# Patient Record
Sex: Male | Born: 1955
Health system: Southern US, Community
[De-identification: ages and names within clinical notes are randomized; demographics above are authoritative.]

## PROBLEM LIST (undated history)

## (undated) DIAGNOSIS — B029 Zoster without complications: Secondary | ICD-10-CM

## (undated) DIAGNOSIS — M542 Cervicalgia: Secondary | ICD-10-CM

## (undated) DIAGNOSIS — M545 Low back pain, unspecified: Secondary | ICD-10-CM

## (undated) DIAGNOSIS — N189 Chronic kidney disease, unspecified: Secondary | ICD-10-CM

## (undated) HISTORY — DX: Low back pain, unspecified: M54.50

## (undated) HISTORY — DX: Low back pain: M54.5

## (undated) HISTORY — DX: Zoster without complications: B02.9

## (undated) HISTORY — DX: Chronic kidney disease, unspecified: N18.9

## (undated) HISTORY — PX: NO PAST SURGERIES: SHX2092

## (undated) HISTORY — DX: Cervicalgia: M54.2

---

## 2004-07-26 ENCOUNTER — Emergency Department: Payer: Self-pay | Admitting: Internal Medicine

## 2006-05-12 ENCOUNTER — Emergency Department: Payer: Self-pay | Admitting: Emergency Medicine

## 2007-03-02 ENCOUNTER — Emergency Department: Payer: Self-pay | Admitting: Emergency Medicine

## 2007-08-08 ENCOUNTER — Emergency Department: Payer: Self-pay | Admitting: Emergency Medicine

## 2007-08-13 ENCOUNTER — Ambulatory Visit: Payer: Self-pay | Admitting: Family Medicine

## 2007-08-13 DIAGNOSIS — M545 Low back pain, unspecified: Secondary | ICD-10-CM | POA: Insufficient documentation

## 2007-09-04 ENCOUNTER — Ambulatory Visit: Payer: Self-pay | Admitting: Orthopedic Surgery

## 2007-09-04 DIAGNOSIS — N189 Chronic kidney disease, unspecified: Secondary | ICD-10-CM

## 2007-09-04 HISTORY — DX: Chronic kidney disease, unspecified: N18.9

## 2008-07-04 DIAGNOSIS — B029 Zoster without complications: Secondary | ICD-10-CM | POA: Insufficient documentation

## 2013-06-03 ENCOUNTER — Ambulatory Visit: Payer: Self-pay | Admitting: Gastroenterology

## 2013-06-03 LAB — HM COLONOSCOPY

## 2013-06-05 LAB — PATHOLOGY REPORT

## 2014-12-05 ENCOUNTER — Telehealth: Payer: Self-pay | Admitting: Family Medicine

## 2014-12-05 NOTE — Telephone Encounter (Signed)
Ok for 45 minute appointment. 

## 2014-12-05 NOTE — Telephone Encounter (Signed)
I called pt and scheduled appt for 12/08/14. Thanks TNP

## 2014-12-05 NOTE — Telephone Encounter (Signed)
See below please-aa 

## 2014-12-05 NOTE — Telephone Encounter (Addendum)
Pt's wife Eber Jones called to get pt a CPE scheduled but pt hasn't been seen since 08/27/09. Eber Jones is a pt of Dr. Santiago Bur and would like for our office to reestablish pt. Pt was with Dr. Katrinka Blazing before she left the office then started seeing Nadine Counts. Can Nadine Counts reestablish pt? Please advise. Thanks TNP

## 2014-12-08 ENCOUNTER — Other Ambulatory Visit
Admission: RE | Admit: 2014-12-08 | Discharge: 2014-12-08 | Disposition: A | Discharge status: Home / Self Care | Payer: 59 | Source: Ambulatory Visit | Attending: Family Medicine | Admitting: Family Medicine

## 2014-12-08 ENCOUNTER — Ambulatory Visit (INDEPENDENT_AMBULATORY_CARE_PROVIDER_SITE_OTHER): Payer: 59 | Admitting: Family Medicine

## 2014-12-08 ENCOUNTER — Encounter: Payer: Self-pay | Admitting: Family Medicine

## 2014-12-08 VITALS — BP 112/70 | HR 53 | Temp 98.7°F | Resp 14 | Ht 74.0 in | Wt 185.6 lb

## 2014-12-08 DIAGNOSIS — Z23 Encounter for immunization: Secondary | ICD-10-CM

## 2014-12-08 DIAGNOSIS — F17200 Nicotine dependence, unspecified, uncomplicated: Secondary | ICD-10-CM

## 2014-12-08 DIAGNOSIS — Z0000 Encounter for general adult medical examination without abnormal findings: Secondary | ICD-10-CM | POA: Diagnosis present

## 2014-12-08 LAB — LIPID PANEL
Cholesterol: 180 mg/dL (ref 0–200)
HDL: 66 mg/dL (ref >40)
LDL Cholesterol: 100 mg/dL — ABNORMAL HIGH (ref 0–99)
Total CHOL/HDL Ratio: 2.7 RATIO
Triglycerides: 71 mg/dL (ref <150)
VLDL: 14 mg/dL (ref 0–40)

## 2014-12-08 LAB — COMPREHENSIVE METABOLIC PANEL
ALBUMIN: 4.6 g/dL (ref 3.5–5.0)
ALK PHOS: 67 U/L (ref 38–126)
ALT: 41 U/L (ref 17–63)
AST: 32 U/L (ref 15–41)
Anion gap: 8 (ref 5–15)
BILIRUBIN TOTAL: 0.5 mg/dL (ref 0.3–1.2)
BUN: 18 mg/dL (ref 6–20)
CO2: 25 mmol/L (ref 22–32)
Calcium: 9.3 mg/dL (ref 8.9–10.3)
Chloride: 106 mmol/L (ref 101–111)
Creatinine, Ser: 1.37 mg/dL — ABNORMAL HIGH (ref 0.61–1.24)
GFR calc Af Amer: >60 mL/min (ref >60)
GFR calc non Af Amer: 55 mL/min — ABNORMAL LOW (ref >60)
Glucose, Bld: 94 mg/dL (ref 65–99)
Potassium: 4.5 mmol/L (ref 3.5–5.1)
SODIUM: 139 mmol/L (ref 135–145)
TOTAL PROTEIN: 7.4 g/dL (ref 6.5–8.1)

## 2014-12-08 LAB — PSA: PSA: 0.64 ng/mL (ref 0.00–4.00)

## 2014-12-08 MED ORDER — BUPROPION HCL ER (SR) 150 MG PO TB12: ORAL_TABLET | ORAL | Status: DC
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
## 2014-12-08 NOTE — Patient Instructions (Signed)
We will call you with lab results.       

## 2014-12-09 ENCOUNTER — Telehealth: Payer: Self-pay

## 2014-12-09 NOTE — Telephone Encounter (Signed)
Spoke with patient on the phone and advised of report, he wants to know if he should drink more water since his kidney numbers were elevated? Or what you recommend? Please advise. KW

## 2014-12-09 NOTE — Telephone Encounter (Signed)
Patient has been advised as below. KW 

## 2014-12-09 NOTE — Telephone Encounter (Signed)
-----   Message from Anola Gurney, [place] sent at 12/09/2014  7:46 AM EDT ----- Labs look good except mild elevation in one of your kidney numbers. Would recheck in 3 months.

## 2014-12-09 NOTE — Telephone Encounter (Signed)
Drinking more water might help if he usually drinks little. Keep a water bottle with him during his work hours and sip throughout the day.

## 2016-01-29 ENCOUNTER — Ambulatory Visit (INDEPENDENT_AMBULATORY_CARE_PROVIDER_SITE_OTHER): Payer: 59 | Admitting: Family Medicine

## 2016-01-29 VITALS — BP 102/64 | HR 62 | Temp 98.7°F | Resp 14 | Wt 189.0 lb

## 2016-01-29 DIAGNOSIS — M79674 Pain in right toe(s): Secondary | ICD-10-CM | POA: Diagnosis not present

## 2016-01-29 MED ORDER — MELOXICAM 15 MG PO TABS: 15.0000 mg | ORAL_TABLET | Freq: Every day | ORAL | 0 refills | Status: DC

## 2016-01-29 NOTE — Patient Instructions (Signed)
Continue wearing new shoes to provide better support. Call me if your toe is not improving over the next week or two.

## 2016-01-29 NOTE — Progress Notes (Signed)
Subjective:     Patient ID: Keith Mendez, male   DOB: Nov 02, 1955, 60 y.o.   MRN: [id number]  HPI  Chief Complaint  Patient presents with  . Foot Pain    patient states he has had right foot pain for about 1 month. Pain comes and goes, sometimes is very achy, has burning sensation at times. Pain located on the inner side of the foot. Swelling is present at times. This has been bothering patient more recently since he is working 2 jobs now and is on his feet more. No injury to the area that he recalls.  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 Was wearing old sneakers which did not give his toe much support. He has recently started wearing new and more supportive foot wear. No hx of gout and states his toe never became red, significantly swollen, or exquisitely tender. Has not taken any medication for his sx.   Review of Systems     Objective:   Physical Exam  Constitutional: He appears well-developed and well-nourished. No distress.  Cardiovascular:  Pulses:      Dorsalis pedis pulses are 2+ on the right side.       Posterior tibial pulses are 2+ on the right side.  Musculoskeletal:  Localizes to his right first MTP joint. No swelling, erythema or tenderness on presentation. DF/PF 5/5       Assessment:    1. Pain of right great toe: suspect overuse injury/osteoarthritis - meloxicam (MOBIC) 15 MG tablet; Take 1 tablet (15 mg total) by mouth daily.  Dispense: 30 tablet; Refill: 0    Plan:    Call if not improving for podiatry referral.

## 2016-06-04 DIAGNOSIS — H52221 Regular astigmatism, right eye: Secondary | ICD-10-CM | POA: Diagnosis not present

## 2016-06-04 DIAGNOSIS — H5203 Hypermetropia, bilateral: Secondary | ICD-10-CM | POA: Diagnosis not present

## 2016-06-04 DIAGNOSIS — H524 Presbyopia: Secondary | ICD-10-CM | POA: Diagnosis not present

## 2016-06-04 DIAGNOSIS — H3562 Retinal hemorrhage, left eye: Secondary | ICD-10-CM | POA: Diagnosis not present

## 2016-07-20 ENCOUNTER — Encounter: Payer: Self-pay | Admitting: Physician Assistant

## 2016-07-20 ENCOUNTER — Ambulatory Visit (INDEPENDENT_AMBULATORY_CARE_PROVIDER_SITE_OTHER): Payer: 59 | Admitting: Physician Assistant

## 2016-07-20 VITALS — BP 134/86 | HR 96 | Temp 102.2°F | Resp 16 | Wt 195.0 lb

## 2016-07-20 DIAGNOSIS — Z20828 Contact with and (suspected) exposure to other viral communicable diseases: Secondary | ICD-10-CM | POA: Diagnosis not present

## 2016-07-20 DIAGNOSIS — M549 Dorsalgia, unspecified: Secondary | ICD-10-CM

## 2016-07-20 DIAGNOSIS — R112 Nausea with vomiting, unspecified: Secondary | ICD-10-CM | POA: Diagnosis not present

## 2016-07-20 DIAGNOSIS — R197 Diarrhea, unspecified: Secondary | ICD-10-CM

## 2016-07-20 LAB — POCT URINALYSIS DIPSTICK
Bilirubin, UA: NEGATIVE
Glucose, UA: NEGATIVE
Ketones, UA: 1.03
Leukocytes, UA: NEGATIVE
Nitrite, UA: NEGATIVE
Protein, UA: NEGATIVE
Spec Grav, UA: >=1.03 — AB (ref 1.010–1.025)
Urobilinogen, UA: 0.2 E.U./dL
pH, UA: 6 (ref 5.0–8.0)

## 2016-07-20 MED ORDER — OSELTAMIVIR PHOSPHATE 75 MG PO CAPS: 75.0000 mg | ORAL_CAPSULE | Freq: Two times a day (BID) | ORAL | 0 refills | Status: AC

## 2016-07-20 MED ORDER — PROMETHAZINE HCL 12.5 MG PO TABS: 12.5000 mg | ORAL_TABLET | Freq: Four times a day (QID) | ORAL | 0 refills | Status: DC | PRN

## 2016-07-20 MED ORDER — CIPROFLOXACIN HCL 500 MG PO TABS: 500.0000 mg | ORAL_TABLET | Freq: Two times a day (BID) | ORAL | 0 refills | Status: DC

## 2016-07-20 NOTE — Progress Notes (Signed)
Patient: Keith Mendez Male    DOB: 08-05-1955   61 y.o.   MRN: [id number] Visit Date: 07/20/2016  Today's Provider: Trey Sailors, PA-C   Chief Complaint  Patient presents with  . Emesis  . Diarrhea   Subjective:    Emesis   This is a new problem. The problem occurs 2 to 4 times per day. The problem has been unchanged. There has been no fever. Associated symptoms include arthralgias, chills, coughing, diarrhea, headaches and URI. Pertinent negatives include no abdominal pain, chest pain, dizziness, fever, myalgias or sweats.  Diarrhea   This is a new problem. The current episode started yesterday. The problem has been unchanged. The patient states that diarrhea does not awaken him from sleep. Associated symptoms include arthralgias, chills, coughing, headaches, a URI and vomiting. Pertinent negatives include no abdominal pain, fever, myalgias or sweats.   Patient is a 61 y/o man presenting today with 1 day of nausea/vomiting/diarrhea and back pain. He said he felt ill suddenly yesterday and vomited four times. He had diarrhea 1-2 that was nonbloody and formed. He has muscle aches. He is denying any abdominal pain. He does report that he is having some low back pain, buttock pain, and difficulty getting comfortable in his seat the past night. He is not having any dysuria, frequency. He is having a fever. He hasn't eaten anything today because he's been so nauseas.   Additionally his wife is in the clinic today. She is testing positive for Flu B.   No Known Allergies   Current Outpatient Prescriptions:  .  buPROPion (WELLBUTRIN SR) 150 MG 12 hr tablet, Start at one pill daily for at least a week., Disp: 60 tablet, Rfl: 2 .  meloxicam (MOBIC) 15 MG tablet, Take 1 tablet (15 mg total) by mouth daily., Disp: 30 tablet, Rfl: 0  Review of Systems  Constitutional: Positive for chills and fatigue. Negative for activity change, appetite change, diaphoresis, fever and unexpected  weight change.  HENT: Positive for congestion, postnasal drip, rhinorrhea and sneezing. Negative for ear discharge, ear pain, nosebleeds, sinus pain, sinus pressure, sore throat, tinnitus, trouble swallowing and voice change.   Eyes: Positive for discharge.  Respiratory: Positive for cough. Negative for apnea, choking, chest tightness, shortness of breath, wheezing and stridor.   Cardiovascular: Negative for chest pain.  Gastrointestinal: Positive for diarrhea, nausea and vomiting. Negative for abdominal distention, abdominal pain, anal bleeding, blood in stool, constipation and rectal pain.  Musculoskeletal: Positive for arthralgias and back pain. Negative for myalgias.  Neurological: Positive for headaches. Negative for dizziness and numbness.    Social History  Substance Use Topics  . Smoking status: Current Every Day Smoker    Packs/day: 0.50    Types: Cigarettes  . Smokeless tobacco: Never Used  . Alcohol use 0.0 oz/week     Comment: occasional   Objective:   BP 134/86 (BP Location: Left Arm, Patient Position: Sitting, Cuff Size: Normal)   Pulse 96   Temp (!) 102.2 F (39 C) (Oral)   Resp 16   Wt 195 lb (88.5 kg)   BMI 25.04 kg/m  Vitals:   07/20/16 1542  BP: 134/86  Pulse: 96  Resp: 16  Temp: (!) 102.2 F (39 C)  TempSrc: Oral  Weight: 195 lb (88.5 kg)     Physical Exam  Constitutional: He is oriented to person, place, and time. He appears well-developed and well-nourished. He appears ill. No distress.  Cardiovascular: Normal rate  and regular rhythm.   Pulmonary/Chest: Effort normal and breath sounds normal.  Abdominal: Soft. Bowel sounds are normal. He exhibits no distension. There is no tenderness. There is CVA tenderness. There is no rebound and no guarding.  Neurological: He is alert and oriented to person, place, and time.  Skin: Skin is warm and dry.  Moist mucous membranes, skin with good turgor.   Psychiatric: He has a normal mood and affect. His behavior  is normal.        Assessment & Plan:     1. Nausea vomiting and diarrhea  Patient is nontoxic in office, though is febrile. His flu was negative but his wife tested positive today in our clinic so will treat empirically. Has significant CVA tenderness in office today with large blood on urine dipstick. Will send of for UA and culture to evaluate for pyelonephritis, but will cover for this as well with cipro. Advised patient to keep taking medications until we tell him otherwise. If his fever increases, he can't stop vomiting, he has severe pain, he needs to be seen emergently.   - POCT urinalysis dipstick - promethazine (PHENERGAN) 12.5 MG tablet; Take 1 tablet (12.5 mg total) by mouth every 6 (six) hours as needed for nausea or vomiting.  Dispense: 30 tablet; Refill: 0 - Urinalysis, microscopic only - CULTURE, URINE COMPREHENSIVE - ciprofloxacin (CIPRO) 500 MG tablet; Take 1 tablet (500 mg total) by mouth 2 (two) times daily.  Dispense: 20 tablet; Refill: 0  2. Acute left-sided back pain, unspecified back location   - POCT urinalysis dipstick - Urinalysis, microscopic only - CULTURE, URINE COMPREHENSIVE - ciprofloxacin (CIPRO) 500 MG tablet; Take 1 tablet (500 mg total) by mouth 2 (two) times daily.  Dispense: 20 tablet; Refill: 0  3. Exposure to the flu  - oseltamivir (TAMIFLU) 75 MG capsule; Take 1 capsule (75 mg total) by mouth 2 (two) times daily.  Dispense: 10 capsule; Refill: 0  Return if symptoms worsen or fail to improve.  The entirety of the information documented in the History of Present Illness, Review of Systems and Physical Exam were personally obtained by me. Portions of this information were initially documented by Kavin Leech, CMA and reviewed by me for thoroughness and accuracy.         Trey Sailors, PA-C  [facility]

## 2016-07-20 NOTE — Patient Instructions (Signed)

## 2016-07-21 LAB — URINALYSIS, MICROSCOPIC ONLY: Casts: NONE SEEN /lpf

## 2016-07-22 LAB — CULTURE, URINE COMPREHENSIVE

## 2016-07-26 ENCOUNTER — Ambulatory Visit
Admission: RE | Admit: 2016-07-26 | Discharge: 2016-07-26 | Disposition: A | Discharge status: Home / Self Care | Payer: 59 | Source: Ambulatory Visit | Attending: Physician Assistant | Admitting: Physician Assistant

## 2016-07-26 ENCOUNTER — Other Ambulatory Visit: Payer: Self-pay | Admitting: Physician Assistant

## 2016-07-26 ENCOUNTER — Ambulatory Visit (INDEPENDENT_AMBULATORY_CARE_PROVIDER_SITE_OTHER): Payer: 59 | Admitting: Physician Assistant

## 2016-07-26 ENCOUNTER — Encounter: Payer: Self-pay | Admitting: Physician Assistant

## 2016-07-26 ENCOUNTER — Telehealth: Payer: Self-pay

## 2016-07-26 ENCOUNTER — Telehealth: Payer: Self-pay | Admitting: Physician Assistant

## 2016-07-26 VITALS — BP 114/78 | HR 68 | Temp 99.2°F | Resp 16 | Wt 189.0 lb

## 2016-07-26 DIAGNOSIS — J189 Pneumonia, unspecified organism: Secondary | ICD-10-CM

## 2016-07-26 DIAGNOSIS — R918 Other nonspecific abnormal finding of lung field: Secondary | ICD-10-CM | POA: Diagnosis not present

## 2016-07-26 DIAGNOSIS — R05 Cough: Secondary | ICD-10-CM

## 2016-07-26 DIAGNOSIS — R509 Fever, unspecified: Secondary | ICD-10-CM

## 2016-07-26 DIAGNOSIS — J181 Lobar pneumonia, unspecified organism: Secondary | ICD-10-CM

## 2016-07-26 DIAGNOSIS — R059 Cough, unspecified: Secondary | ICD-10-CM

## 2016-07-26 MED ORDER — AZITHROMYCIN 250 MG PO TABS: ORAL_TABLET | ORAL | 0 refills | Status: DC

## 2016-07-26 NOTE — Progress Notes (Signed)
Left message at home number listed to call back.  Patient has taken one week on cipro for suspected pyelonephritis with good clinical resolution. Came in today with persistent cough with CXR showing pneumonia. Will discontinue cipro and have him take Z-pack. Would like to see him back in clinic in one week for recheck and repeat urine and will have follow up CXR in 1 month.

## 2016-07-26 NOTE — Telephone Encounter (Signed)
Pt advised.   Thanks,   -Laura  

## 2016-07-26 NOTE — Telephone Encounter (Signed)
-----   Message from Adline Peals, CMA sent at 07/26/2016 10:10 AM EDT ----- [facility]  ED

## 2016-07-26 NOTE — Progress Notes (Signed)
Patient: Keith Mendez Male    DOB: Feb 17, 1956   61 y.o.   MRN: [id number] Visit Date: 07/26/2016  Today's Provider: Trey Sailors, PA-C   Chief Complaint  Patient presents with  . Cough   Subjective:    Cough  This is a new problem. The current episode started 1 to 4 weeks ago. The problem has been unchanged. The cough is non-productive. Associated symptoms include myalgias, nasal congestion, postnasal drip, rhinorrhea, shortness of breath and wheezing. Pertinent negatives include no chest pain, chills, ear congestion, ear pain, eye redness, fever, headaches, heartburn, hemoptysis, rash, sore throat, sweats or weight loss.   Keith Mendez is a 61 y/o male who smokes 1/3 packs per day who presents today for the cough. He was seen by myself 1 week ago for respiratory symptoms and back pain in the clinic. His wife was dx with the flu. He was febrile to 102.2 F and had blood in his urine on dipstick as well as CVA tenderness. Treated empirically with cipro 500 mg BID x 10 days for pyelonephritis and urine cx did not grow anything.   Returning today for nagging cough. Not productive but feels like it could be. No fevers, back pain has resolved, urinating okay. Reduced appetite. No sinus congestion. Back pain and fever have improved.  No Known Allergies   Current Outpatient Prescriptions:  .  buPROPion (WELLBUTRIN SR) 150 MG 12 hr tablet, Start at one pill daily for at least a week., Disp: 60 tablet, Rfl: 2 .  meloxicam (MOBIC) 15 MG tablet, Take 1 tablet (15 mg total) by mouth daily., Disp: 30 tablet, Rfl: 0 .  azithromycin (ZITHROMAX) 250 MG tablet, Take 2 tablets on day 1, and one tablet on the following four days., Disp: 6 tablet, Rfl: 0 .  promethazine (PHENERGAN) 12.5 MG tablet, Take 1 tablet (12.5 mg total) by mouth every 6 (six) hours as needed for nausea or vomiting. (Patient not taking: Reported on 07/26/2016), Disp: 30 tablet, Rfl: 0  Review of Systems  Constitutional:  Positive for appetite change and fatigue. Negative for activity change, chills, diaphoresis, fever, unexpected weight change and weight loss.  HENT: Positive for congestion, postnasal drip, rhinorrhea and sneezing. Negative for ear discharge, ear pain, nosebleeds, sinus pain, sinus pressure, sore throat, tinnitus and trouble swallowing.   Eyes: Positive for discharge. Negative for photophobia, pain, redness, itching and visual disturbance.  Respiratory: Positive for cough, shortness of breath and wheezing. Negative for apnea, hemoptysis, choking, chest tightness and stridor.   Cardiovascular: Negative for chest pain.  Gastrointestinal: Positive for nausea. Negative for abdominal distention, abdominal pain, anal bleeding, blood in stool, constipation, diarrhea, heartburn, rectal pain and vomiting.  Musculoskeletal: Positive for arthralgias and myalgias. Negative for back pain, gait problem, joint swelling, neck pain and neck stiffness.  Skin: Negative for rash.  Neurological: Positive for light-headedness (Pt says he feels "Off balance"). Negative for dizziness and headaches.    Social History  Substance Use Topics  . Smoking status: Current Every Day Smoker    Packs/day: 0.50    Types: Cigarettes  . Smokeless tobacco: Never Used  . Alcohol use 0.0 oz/week     Comment: occasional   Objective:   BP 114/78 (BP Location: Left Arm, Patient Position: Sitting, Cuff Size: Normal)   Pulse 68   Temp 99.2 F (37.3 C) (Oral)   Resp 16   Wt 189 lb (85.7 kg)   SpO2 97%   BMI 24.27 kg/m  Vitals:   07/26/16 1108  BP: 114/78  Pulse: 68  Resp: 16  Temp: 99.2 F (37.3 C)  TempSrc: Oral  SpO2: 97%  Weight: 189 lb (85.7 kg)     Physical Exam  Constitutional: He appears well-developed and well-nourished.  HENT:  Right Ear: External ear normal.  Left Ear: External ear normal.  Mouth/Throat: Oropharynx is clear and moist. No oropharyngeal exudate.  Eyes: Conjunctivae are normal.  Neck: Neck  supple.  Cardiovascular: Normal rate and regular rhythm.   Pulmonary/Chest: Effort normal. No respiratory distress. He has no wheezes.  Scattered ronchi in bilateral lung fields that clear with coughing.  Abdominal: Soft. Bowel sounds are normal. There is no CVA tenderness.  Lymphadenopathy:    He has no cervical adenopathy.  Skin: Skin is warm and dry.        Assessment & Plan:     1. Pneumonia of right middle lobe due to infectious organism (HCC)  CXR showed pneumonia. Will D/C cipro as he has had 1 week treatment and start Z-pack. Want him to follow up in one week for recheck, repeat urine. Will need follow up CXR in one month to monitor complete resolution.  2. Cough  See above.  - DG Chest 2 View; Future  3. Elevated temperature  See above.  - DG Chest 2 View; Future  The entirety of the information documented in the History of Present Illness, Review of Systems and Physical Exam were personally obtained by me. Portions of this information were initially documented by Kavin Leech, CMA and reviewed by me for thoroughness and accuracy.   Return in about 1 week (around 08/02/2016).       Trey Sailors, PA-C  [facility]

## 2016-07-26 NOTE — Telephone Encounter (Signed)
Left message to call back about results

## 2016-07-26 NOTE — Patient Instructions (Addendum)
MUCINEX DM AND PLENTY OF FLUIDS  Acute Bronchitis, Adult Acute bronchitis is when air tubes (bronchi) in the lungs suddenly get swollen. The condition can make it hard to breathe. It can also cause these symptoms:  A cough.  Coughing up clear, yellow, or green mucus.  Wheezing.  Chest congestion.  Shortness of breath.  A fever.  Body aches.  Chills.  A sore throat. Follow these instructions at home: Medicines   Take over-the-counter and prescription medicines only as told by your doctor.  If you were prescribed an antibiotic medicine, take it as told by your doctor. Do not stop taking the antibiotic even if you start to feel better. General instructions   Rest.  Drink enough fluids to keep your pee (urine) clear or pale yellow.  Avoid smoking and secondhand smoke. If you smoke and you need help quitting, ask your doctor. Quitting will help your lungs heal faster.  Use an inhaler, cool mist vaporizer, or humidifier as told by your doctor.  Keep all follow-up visits as told by your doctor. This is important. How is this prevented? To lower your risk of getting this condition again:  Wash your hands often with soap and water. If you cannot use soap and water, use hand sanitizer.  Avoid contact with people who have cold symptoms.  Try not to touch your hands to your mouth, nose, or eyes.  Make sure to get the flu shot every year. Contact a doctor if:  Your symptoms do not get better in 2 weeks. Get help right away if:  You cough up blood.  You have chest pain.  You have very bad shortness of breath.  You become dehydrated.  You faint (pass out) or keep feeling like you are going to pass out.  You keep throwing up (vomiting).  You have a very bad headache.  Your fever or chills gets worse. This information is not intended to replace advice given to you by your health care provider. Make sure you discuss any questions you have with your health care  provider. Document Released: 09/14/2007 Document Revised: 11/04/2015 Document Reviewed: 09/16/2015 Elsevier Interactive Patient Education  2017 [employer].

## 2016-07-27 NOTE — Telephone Encounter (Signed)
-----   Message from Trey Sailors, [place] sent at 07/27/2016  8:21 AM EDT ----- CXR did in fact show some pneumonia. It may be viral, but we treat with antibiotics anyway. I have sent in azithromycin. I want patient to discontinue his ciprofloxacin and start azithromcyin since they have interactions. I called him twice yesterday with no success. I also want him to follow up next week on Tuesday with me in clinic.

## 2016-07-27 NOTE — Telephone Encounter (Signed)
LMTCB

## 2016-07-29 NOTE — Telephone Encounter (Signed)
LMTCB 07/29/2016  Thanks,   -Vernona Rieger

## 2016-07-29 NOTE — Telephone Encounter (Signed)
[id number] 

## 2016-08-02 NOTE — Telephone Encounter (Signed)
Tried calling wife's number; her mailbox is full.   Thanks,   -Vernona Rieger

## 2016-08-02 NOTE — Telephone Encounter (Signed)
Mailbox is now full and can not take any messages. Will discuss with MA letter to be sent to patient.

## 2017-03-17 ENCOUNTER — Ambulatory Visit
Admission: RE | Admit: 2017-03-17 | Discharge: 2017-03-17 | Disposition: A | Discharge status: Home / Self Care | Payer: 59 | Source: Ambulatory Visit | Attending: Physician Assistant | Admitting: Physician Assistant

## 2017-03-17 ENCOUNTER — Encounter: Payer: Self-pay | Admitting: Physician Assistant

## 2017-03-17 ENCOUNTER — Ambulatory Visit (INDEPENDENT_AMBULATORY_CARE_PROVIDER_SITE_OTHER): Payer: 59 | Admitting: Physician Assistant

## 2017-03-17 VITALS — BP 118/68 | HR 84 | Temp 98.9°F | Resp 16 | Ht 74.0 in | Wt 192.0 lb

## 2017-03-17 DIAGNOSIS — R05 Cough: Secondary | ICD-10-CM

## 2017-03-17 DIAGNOSIS — R059 Cough, unspecified: Secondary | ICD-10-CM

## 2017-03-17 MED ORDER — PREDNISONE 20 MG PO TABS: 20.0000 mg | ORAL_TABLET | Freq: Two times a day (BID) | ORAL | 0 refills | Status: AC

## 2017-03-17 MED ORDER — DOXYCYCLINE HYCLATE 100 MG PO TABS
100.0000 mg | ORAL_TABLET | Freq: Two times a day (BID) | ORAL | 0 refills | Status: AC
Start: 2017-03-17 — End: 2017-03-24

## 2017-03-17 NOTE — Progress Notes (Signed)
Patient: Keith BonierClifton L Bartolome Male    DOB: 06-Aug-1955   61 y.o.   MRN: [id number] Visit Date: 03/17/2017  Today's Provider: Trey SailorsAdriana M Pollak, PA-C   Chief Complaint  Patient presents with  . Sinusitis   Subjective:    Keith Mendez is a 61 y/o man with 21 pack year smoking history presents today with cough and below symptoms. No SOB, chest pain. He was diagnosed with pneumonia in 07/2016 and was very difficult to reach. He cannot remember if he ever took the azithromycin. He did not get follow up xray to see resolution.  Sinusitis  This is a new problem. The current episode started in the past 7 days (about 3 days). The problem has been gradually worsening since onset. There has been no fever. The pain is moderate. Associated symptoms include congestion, coughing, headaches, sinus pressure and sneezing. Past treatments include acetaminophen and oral decongestants. The treatment provided mild relief.       No Known Allergies   Current Outpatient Medications:  .  buPROPion (WELLBUTRIN SR) 150 MG 12 hr tablet, Start at one pill daily for at least a week., Disp: 60 tablet, Rfl: 2 .  meloxicam (MOBIC) 15 MG tablet, Take 1 tablet (15 mg total) by mouth daily., Disp: 30 tablet, Rfl: 0 .  doxycycline (VIBRA-TABS) 100 MG tablet, Take 1 tablet (100 mg total) by mouth 2 (two) times daily for 7 days., Disp: 14 tablet, Rfl: 0 .  predniSONE (DELTASONE) 20 MG tablet, Take 1 tablet (20 mg total) by mouth 2 (two) times daily with a meal for 5 days., Disp: 10 tablet, Rfl: 0 .  promethazine (PHENERGAN) 12.5 MG tablet, Take 1 tablet (12.5 mg total) by mouth every 6 (six) hours as needed for nausea or vomiting. (Patient not taking: Reported on 07/26/2016), Disp: 30 tablet, Rfl: 0  Review of Systems  HENT: Positive for congestion, sinus pressure and sneezing.   Respiratory: Positive for cough.   Neurological: Positive for headaches.    Social History   Tobacco Use  . Smoking status: Current Every  Day Smoker    Packs/day: 0.50    Types: Cigarettes  . Smokeless tobacco: Never Used  Substance Use Topics  . Alcohol use: Yes    Alcohol/week: 0.0 oz    Comment: occasional   Objective:   BP 118/68 (BP Location: Right Arm, Patient Position: Sitting, Cuff Size: Normal)   Pulse 84   Temp 98.9 F (37.2 C)   Resp 16   Ht 6\' 2"  (1.88 m)   Wt 192 lb (87.1 kg)   SpO2 96%   BMI 24.65 kg/m  Vitals:   03/17/17 1341  BP: 118/68  Pulse: 84  Resp: 16  Temp: 98.9 F (37.2 C)  SpO2: 96%  Weight: 192 lb (87.1 kg)  Height: 6\' 2"  (1.88 m)     Physical Exam  Constitutional: He is oriented to person, place, and time. He appears well-developed and well-nourished. He appears ill.  HENT:  Right Ear: Tympanic membrane and external ear normal.  Left Ear: Tympanic membrane and external ear normal.  Mouth/Throat: Oropharynx is clear and moist. No oropharyngeal exudate.  Eyes: Conjunctivae are normal.  Neck: Neck supple.  Cardiovascular: Normal rate and regular rhythm.  Pulmonary/Chest: Effort normal. He has wheezes. He has no rales.  Diffuse wheezing and rhonchi  Lymphadenopathy:    He has no cervical adenopathy.  Neurological: He is alert and oriented to person, place, and time.  Skin:  Skin is warm and dry.  Psychiatric: He has a normal mood and affect. His behavior is normal.        Assessment & Plan:     1. Cough  61 y/o man with significant tobacco abuse and history of pneumonia, possibly untreated. Will treat as bronchitis/possible COPD exacerbation. He does not carry diagnosis but does have significant smoking history. Will get CXR to follow up from last image showing pneumonia. If he does not hear from me, he should take both steroids and abx. If there are significant findings on CXR, I will direct him otherwise.   - DG Chest 2 View; Future - doxycycline (VIBRA-TABS) 100 MG tablet; Take 1 tablet (100 mg total) by mouth 2 (two) times daily for 7 days.  Dispense: 14 tablet;  Refill: 0 - predniSONE (DELTASONE) 20 MG tablet; Take 1 tablet (20 mg total) by mouth 2 (two) times daily with a meal for 5 days.  Dispense: 10 tablet; Refill: 0  Return if symptoms worsen or fail to improve.  The entirety of the information documented in the History of Present Illness, Review of Systems and Physical Exam were personally obtained by me. Portions of this information were initially documented by Kavin LeechLaura Walsh, CMA and reviewed by me for thoroughness and accuracy.        Trey SailorsAdriana M Pollak, PA-C  [facility]

## 2017-03-17 NOTE — Patient Instructions (Signed)
If you do not get a call from me, please take both steroids and antibiotics. Otherwise, I will call to direct you.   Acute Bronchitis, Adult Acute bronchitis is when air tubes (bronchi) in the lungs suddenly get swollen. The condition can make it hard to breathe. It can also cause these symptoms:  A cough.  Coughing up clear, yellow, or green mucus.  Wheezing.  Chest congestion.  Shortness of breath.  A fever.  Body aches.  Chills.  A sore throat.  Follow these instructions at home: Medicines  Take over-the-counter and prescription medicines only as told by your doctor.  If you were prescribed an antibiotic medicine, take it as told by your doctor. Do not stop taking the antibiotic even if you start to feel better. General instructions  Rest.  Drink enough fluids to keep your pee (urine) clear or pale yellow.  Avoid smoking and secondhand smoke. If you smoke and you need help quitting, ask your doctor. Quitting will help your lungs heal faster.  Use an inhaler, cool mist vaporizer, or humidifier as told by your doctor.  Keep all follow-up visits as told by your doctor. This is important. How is this prevented? To lower your risk of getting this condition again:  Wash your hands often with soap and water. If you cannot use soap and water, use hand sanitizer.  Avoid contact with people who have cold symptoms.  Try not to touch your hands to your mouth, nose, or eyes.  Make sure to get the flu shot every year.  Contact a doctor if:  Your symptoms do not get better in 2 weeks. Get help right away if:  You cough up blood.  You have chest pain.  You have very bad shortness of breath.  You become dehydrated.  You faint (pass out) or keep feeling like you are going to pass out.  You keep throwing up (vomiting).  You have a very bad headache.  Your fever or chills gets worse. This information is not intended to replace advice given to you by your health  care provider. Make sure you discuss any questions you have with your health care provider. Document Released: 09/14/2007 Document Revised: 11/04/2015 Document Reviewed: 09/16/2015 Elsevier Interactive Patient Education  2017 [employer]

## 2017-03-21 ENCOUNTER — Telehealth: Payer: Self-pay

## 2017-03-21 NOTE — Telephone Encounter (Signed)
-----   Message from Trey SailorsAdriana M Pollak, [place]-C sent at 03/21/2017  1:39 PM EST ----- CXR was clear, and improved from last time as well. Patient should be largely done with his abx and steroids by now. How is he feeling?

## 2017-03-21 NOTE — Telephone Encounter (Signed)
Tried calling; pt's voicemail box is full.   Thanks,   -Markez Dowland  

## 2017-03-29 NOTE — Telephone Encounter (Signed)
Tried calling; no answer.  03/29/2017   Thanks,   -Vernona RiegerLaura

## 2017-03-30 NOTE — Telephone Encounter (Signed)
Mailbox is full.

## 2017-05-29 ENCOUNTER — Telehealth: Payer: Self-pay

## 2017-05-29 ENCOUNTER — Ambulatory Visit (INDEPENDENT_AMBULATORY_CARE_PROVIDER_SITE_OTHER): Payer: 59 | Admitting: Physician Assistant

## 2017-05-29 ENCOUNTER — Encounter: Payer: Self-pay | Admitting: Physician Assistant

## 2017-05-29 VITALS — BP 110/72 | HR 84 | Temp 98.9°F | Resp 16 | Wt 194.0 lb

## 2017-05-29 DIAGNOSIS — M25571 Pain in right ankle and joints of right foot: Secondary | ICD-10-CM

## 2017-05-29 MED ORDER — MELOXICAM 7.5 MG PO TABS: ORAL_TABLET | ORAL | 0 refills | Status: DC

## 2017-05-29 NOTE — Patient Instructions (Signed)
Ankle Pain Many things can cause ankle pain, including an injury to the area and overuse of the ankle.The ankle joint holds your body weight and allows you to move around. Ankle pain can occur on either side or the back of one ankle or both ankles. Ankle pain may be sharp and burning or dull and aching. There may be tenderness, stiffness, redness, or warmth around the ankle. Follow these instructions at home: Activity  Rest your ankle as told by your health care provider. Avoid any activities that cause ankle pain.  Do exercises as told by your health care provider.  Ask your health care provider if you can drive. Using a brace, a bandage, or crutches  If you were given a brace: ? Wear it as told by your health care provider. ? Remove it when you take a bath or a shower. ? Try not to move your ankle very much, but wiggle your toes from time to time. This helps to prevent swelling.  If you were given an elastic bandage: ? Remove it when you take a bath or a shower. ? Try not to move your ankle very much, but wiggle your toes from time to time. This helps to prevent swelling. ? Adjust the bandage to make it more comfortable if it feels too tight. ? Loosen the bandage if you have numbness or tingling in your foot or if your foot turns cold and blue.  If you have crutches, use them as told by your health care provider. Continue to use them until you can walk without feeling pain in your ankle. Managing pain, stiffness, and swelling  Raise (elevate) your ankle above the level of your heart while you are sitting or lying down.  If directed, apply ice to the area: ? Put ice in a plastic bag. ? Place a towel between your skin and the bag. ? Leave the ice on for 20 minutes, 2-3 times per day. General instructions  Keep all follow-up visits as told by your health care provider. This is important.  Record this information that may be helpful for you and your health care provider: ? How  often you have ankle pain. ? Where the pain is located. ? What the pain feels like.  Take over-the-counter and prescription medicines only as told by your health care provider. Contact a health care provider if:  Your pain gets worse.  Your pain is not relieved with medicines.  You have a fever or chills.  You are having more trouble with walking.  You have new symptoms. Get help right away if:  Your foot, leg, toes, or ankle tingles or becomes numb.  Your foot, leg, toes, or ankle becomes swollen.  Your foot, leg, toes, or ankle turns pale or blue. This information is not intended to replace advice given to you by your health care provider. Make sure you discuss any questions you have with your health care provider. Document Released: 09/15/2009 Document Revised: 11/27/2015 Document Reviewed: 10/28/2014 Elsevier Interactive Patient Education  2018 Elsevier Inc.  

## 2017-05-29 NOTE — Progress Notes (Signed)
Patient: Keith Mendez Male    DOB: [birth year]   62 y.o.   MRN: [id number] Visit Date: 05/29/2017  Today's Provider: Trey Sailors, PA-C   Chief Complaint  Patient presents with  . Cyst    Right ankle    Subjective:    HPI   Keith Mendez is a 62 y/o man presenting today with right ankle pain x 3 days. He reports an area of redness and swelling on the outside of his right ankle. He denies pain when walking or moving his foot. Denies any injuries, twisting motions. He denies any open wound or bleeding. He denies any rash. There is some warmth to the area. He has had this before and it has usually gone away but this time it has persisted. He normally wears boots with thick boot socks but he was unable to tolerate them today.       No Known Allergies   Current Outpatient Medications:  .  buPROPion (WELLBUTRIN SR) 150 MG 12 hr tablet, Start at one pill daily for at least a week., Disp: 60 tablet, Rfl: 2 .  meloxicam (MOBIC) 15 MG tablet, Take 1 tablet (15 mg total) by mouth daily., Disp: 30 tablet, Rfl: 0 .  promethazine (PHENERGAN) 12.5 MG tablet, Take 1 tablet (12.5 mg total) by mouth every 6 (six) hours as needed for nausea or vomiting. (Patient not taking: Reported on 07/26/2016), Disp: 30 tablet, Rfl: 0  Review of Systems  Constitutional: Negative.   Musculoskeletal: Positive for arthralgias. Negative for back pain, gait problem, joint swelling, myalgias, neck pain and neck stiffness.    Social History   Tobacco Use  . Smoking status: Current Every Day Smoker    Packs/day: 0.50    Types: Cigarettes  . Smokeless tobacco: Never Used  Substance Use Topics  . Alcohol use: Yes    Alcohol/week: 0.0 oz    Comment: occasional   Objective:   BP 110/72 (BP Location: Right Arm, Patient Position: Sitting, Cuff Size: Large)   Pulse 84   Temp 98.9 F (37.2 C) (Oral)   Resp 16   Wt 194 lb (88 kg)   BMI 24.91 kg/m  Vitals:   05/29/17 1603  BP: 110/72  Pulse: 84   Resp: 16  Temp: 98.9 F (37.2 C)  TempSrc: Oral  Weight: 194 lb (88 kg)     Physical Exam  Constitutional: He is oriented to person, place, and time. He appears well-developed and well-nourished.  Cardiovascular: Normal rate and regular rhythm.  Pulses:      Dorsalis pedis pulses are 2+ on the right side, and 2+ on the left side.       Posterior tibial pulses are 2+ on the right side, and 2+ on the left side.  Cap refill <3 seconds bilateral lower extremities.  Musculoskeletal:       Feet:  Neurological: He is alert and oriented to person, place, and time.  Skin: Skin is warm and dry. No rash noted. There is erythema.  Psychiatric: He has a normal mood and affect. His behavior is normal.        Assessment & Plan:     1. Acute right ankle pain  There is no overlying rash or defined lesion, however small area of erythema and swelling over right lateral malleolus. Low suspicion for gout, fracture, dermatitis. Pulses in tact, bilateral lower extremities. Suspect some malleolar bursitis from boots. Instructed on NSAID use, abstain from wearing boots  right now.   - meloxicam (MOBIC) 7.5 MG tablet; Take 1 to 2 tablets daily PRN for pain.  Dispense: 30 tablet; Refill: 0  Return if symptoms worsen or fail to improve.  The entirety of the information documented in the History of Present Illness, Review of Systems and Physical Exam were personally obtained by me. Portions of this information were initially documented by Kavin LeechLaura Walsh, CMA and reviewed by me for thoroughness and accuracy.        Trey SailorsAdriana M Pollak, PA-C  [facility]

## 2017-05-29 NOTE — Telephone Encounter (Signed)
Patient called office today with concerns of a "bump/knot" that appeared three days agao on his lower leg right above ankle. Patient denies that "bump" has grown in size but states that is is painful to the touch and feels like it is throbbing. Patient denies leg pain or tenderness , he denies swelling, fever or warmth to the touch at site. Patient denied any injury that he can recall or insect bite. Patient has an appt this afternoon at 4Pm. [facility]

## 2017-05-29 NOTE — Telephone Encounter (Signed)
Noted, thank you

## 2017-08-30 ENCOUNTER — Encounter: Payer: Self-pay | Admitting: Physician Assistant

## 2017-08-30 ENCOUNTER — Ambulatory Visit (INDEPENDENT_AMBULATORY_CARE_PROVIDER_SITE_OTHER): Payer: 59 | Admitting: Physician Assistant

## 2017-08-30 ENCOUNTER — Ambulatory Visit: Payer: 59 | Admitting: Physician Assistant

## 2017-08-30 VITALS — BP 124/86 | HR 68 | Temp 98.2°F | Resp 16 | Wt 192.0 lb

## 2017-08-30 DIAGNOSIS — H6122 Impacted cerumen, left ear: Secondary | ICD-10-CM

## 2017-08-30 NOTE — Progress Notes (Signed)
Patient: Keith Mendez Male    DOB: 06-11-1955   62 y.o.   MRN: [id number] Visit Date: 08/30/2017  Today's Provider: Trey Sailors, PA-C   Chief Complaint  Patient presents with  . Hearing Loss    Left ear.  Started Sunday   Subjective:    Ear Fullness   There is pain in the left ear. This is a new problem. The current episode started in the past 7 days. The problem occurs constantly. The problem has been unchanged. There has been no fever. The patient is experiencing no pain. Associated symptoms include coughing, hearing loss and rhinorrhea. Pertinent negatives include no ear discharge, headaches or sore throat.       No Known Allergies   Current Outpatient Medications:  .  buPROPion (WELLBUTRIN SR) 150 MG 12 hr tablet, Start at one pill daily for at least a week. (Patient not taking: Reported on 08/30/2017), Disp: 60 tablet, Rfl: 2 .  meloxicam (MOBIC) 7.5 MG tablet, Take 1 to 2 tablets daily PRN for pain. (Patient not taking: Reported on 08/30/2017), Disp: 30 tablet, Rfl: 0 .  promethazine (PHENERGAN) 12.5 MG tablet, Take 1 tablet (12.5 mg total) by mouth every 6 (six) hours as needed for nausea or vomiting. (Patient not taking: Reported on 07/26/2016), Disp: 30 tablet, Rfl: 0  Review of Systems  Constitutional: Negative.   HENT: Positive for hearing loss and rhinorrhea. Negative for congestion, ear discharge, ear pain, postnasal drip, sinus pressure, sinus pain, sore throat, tinnitus and trouble swallowing.   Eyes: Negative.   Respiratory: Positive for cough. Negative for apnea, choking, chest tightness, shortness of breath, wheezing and stridor.   Gastrointestinal: Negative.   Neurological: Negative for dizziness, light-headedness and headaches.    Social History   Tobacco Use  . Smoking status: Current Every Day Smoker    Packs/day: 0.50    Types: Cigarettes  . Smokeless tobacco: Never Used  Substance Use Topics  . Alcohol use: Yes    Alcohol/week: 0.0 oz      Comment: occasional   Objective:   BP 124/86 (BP Location: Right Arm, Patient Position: Sitting, Cuff Size: Normal)   Pulse 68   Temp 98.2 F (36.8 C) (Oral)   Resp 16   Wt 192 lb (87.1 kg)   BMI 24.65 kg/m  Vitals:   08/30/17 0952  BP: 124/86  Pulse: 68  Resp: 16  Temp: 98.2 F (36.8 C)  TempSrc: Oral  Weight: 192 lb (87.1 kg)     Physical Exam  Constitutional: He is oriented to person, place, and time. He appears well-developed and well-nourished.  HENT:  Right Ear: Tympanic membrane and external ear normal.  Left Ear: External ear normal.  Left TM obstructed by wax. Resolved after lavage.   Cardiovascular: Normal rate and regular rhythm.  Pulmonary/Chest: Effort normal and breath sounds normal.  Neurological: He is alert and oriented to person, place, and time.  Skin: Skin is warm and dry.  Psychiatric: He has a normal mood and affect. His behavior is normal.        Assessment & Plan:     1. Impacted cerumen of left ear  Resolved after ear lavage. Counseled on use of Debrox drops to soften wax.  Return if symptoms worsen or fail to improve.  The entirety of the information documented in the History of Present Illness, Review of Systems and Physical Exam were personally obtained by me. Portions of this information were initially documented  by Kavin Leech, CMA and reviewed by me for thoroughness and accuracy.          Trey Sailors, PA-C  [facility]

## 2017-08-30 NOTE — Patient Instructions (Signed)
Earwax Buildup, Adult The ears produce a substance called earwax that helps keep bacteria out of the ear and protects the skin in the ear canal. Occasionally, earwax can build up in the ear and cause discomfort or hearing loss. What increases the risk? This condition is more likely to develop in people who:  Are male.  Are elderly.  Naturally produce more earwax.  Clean their ears often with cotton swabs.  Use earplugs often.  Use in-ear headphones often.  Wear hearing aids.  Have narrow ear canals.  Have earwax that is overly thick or sticky.  Have eczema.  Are dehydrated.  Have excess hair in the ear canal.  What are the signs or symptoms? Symptoms of this condition include:  Reduced or muffled hearing.  A feeling of fullness in the ear or feeling that the ear is plugged.  Fluid coming from the ear.  Ear pain.  Ear itch.  Ringing in the ear.  Coughing.  An obvious piece of earwax that can be seen inside the ear canal.  How is this diagnosed? This condition may be diagnosed based on:  Your symptoms.  Your medical history.  An ear exam. During the exam, your health care provider will look into your ear with an instrument called an otoscope.  You may have tests, including a hearing test. How is this treated? This condition may be treated by:  Using ear drops to soften the earwax.  Having the earwax removed by a health care provider. The health care provider may: ? Flush the ear with water. ? Use an instrument that has a loop on the end (curette). ? Use a suction device.  Surgery to remove the wax buildup. This may be done in severe cases.  Follow these instructions at home:  Take over-the-counter and prescription medicines only as told by your health care provider.  Do not put any objects, including cotton swabs, into your ear. You can clean the opening of your ear canal with a washcloth or facial tissue.  Follow instructions from your health  care provider about cleaning your ears. Do not over-clean your ears.  Drink enough fluid to keep your urine clear or pale yellow. This will help to thin the earwax.  Keep all follow-up visits as told by your health care provider. If earwax builds up in your ears often or if you use hearing aids, consider seeing your health care provider for routine, preventive ear cleanings. Ask your health care provider how often you should schedule your cleanings.  If you have hearing aids, clean them according to instructions from the manufacturer and your health care provider. Contact a health care provider if:  You have ear pain.  You develop a fever.  You have blood, pus, or other fluid coming from your ear.  You have hearing loss.  You have ringing in your ears that does not go away.  Your symptoms do not improve with treatment.  You feel like the room is spinning (vertigo). Summary  Earwax can build up in the ear and cause discomfort or hearing loss.  The most common symptoms of this condition include reduced or muffled hearing and a feeling of fullness in the ear or feeling that the ear is plugged.  This condition may be diagnosed based on your symptoms, your medical history, and an ear exam.  This condition may be treated by using ear drops to soften the earwax or by having the earwax removed by a health care provider.  Do   not put any objects, including cotton swabs, into your ear. You can clean the opening of your ear canal with a washcloth or facial tissue. This information is not intended to replace advice given to you by your health care provider. Make sure you discuss any questions you have with your health care provider. Document Released: 05/05/2004 Document Revised: 06/08/2016 Document Reviewed: 06/08/2016 Elsevier Interactive Patient Education  2018 [employer].  

## 2017-10-04 ENCOUNTER — Encounter: Payer: Self-pay | Admitting: Physician Assistant

## 2017-10-04 ENCOUNTER — Ambulatory Visit (INDEPENDENT_AMBULATORY_CARE_PROVIDER_SITE_OTHER): Payer: 59 | Admitting: Physician Assistant

## 2017-10-04 ENCOUNTER — Other Ambulatory Visit
Admission: RE | Admit: 2017-10-04 | Discharge: 2017-10-04 | Disposition: A | Discharge status: Home / Self Care | Payer: 59 | Source: Ambulatory Visit | Attending: Physician Assistant | Admitting: Physician Assistant

## 2017-10-04 VITALS — BP 122/84 | HR 76 | Temp 98.4°F | Resp 16 | Ht 74.0 in | Wt 190.0 lb

## 2017-10-04 DIAGNOSIS — Z1159 Encounter for screening for other viral diseases: Secondary | ICD-10-CM

## 2017-10-04 DIAGNOSIS — Z114 Encounter for screening for human immunodeficiency virus [HIV]: Secondary | ICD-10-CM | POA: Diagnosis not present

## 2017-10-04 DIAGNOSIS — F17210 Nicotine dependence, cigarettes, uncomplicated: Secondary | ICD-10-CM | POA: Insufficient documentation

## 2017-10-04 DIAGNOSIS — Z131 Encounter for screening for diabetes mellitus: Secondary | ICD-10-CM | POA: Diagnosis not present

## 2017-10-04 DIAGNOSIS — Z125 Encounter for screening for malignant neoplasm of prostate: Secondary | ICD-10-CM | POA: Diagnosis not present

## 2017-10-04 DIAGNOSIS — Z130 Encounter for screening for diseases of the blood and blood-forming organs and certain disorders involving the immune mechanism: Secondary | ICD-10-CM

## 2017-10-04 DIAGNOSIS — Z0000 Encounter for general adult medical examination without abnormal findings: Secondary | ICD-10-CM | POA: Insufficient documentation

## 2017-10-04 DIAGNOSIS — Z1329 Encounter for screening for other suspected endocrine disorder: Secondary | ICD-10-CM | POA: Diagnosis not present

## 2017-10-04 DIAGNOSIS — Z720 Tobacco use: Secondary | ICD-10-CM | POA: Diagnosis not present

## 2017-10-04 DIAGNOSIS — Z13220 Encounter for screening for lipoid disorders: Secondary | ICD-10-CM

## 2017-10-04 LAB — LIPID PANEL
Cholesterol: 174 mg/dL (ref 0–200)
HDL: 54 mg/dL (ref >40)
LDL Cholesterol: 110 mg/dL — ABNORMAL HIGH (ref 0–99)
Total CHOL/HDL Ratio: 3.2 RATIO
Triglycerides: 50 mg/dL (ref <150)
VLDL: 10 mg/dL (ref 0–40)

## 2017-10-04 LAB — CBC WITH DIFFERENTIAL/PLATELET
Basophils Absolute: 0.1 10*3/uL (ref 0–0.1)
Basophils Relative: 1 %
Eosinophils Absolute: 0.1 10*3/uL (ref 0–0.7)
Eosinophils Relative: 2 %
HCT: 42.1 % (ref 40.0–52.0)
Hemoglobin: 14.1 g/dL (ref 13.0–18.0)
Lymphocytes Relative: 29 %
Lymphs Abs: 1.9 10*3/uL (ref 1.0–3.6)
MCH: 29.7 pg (ref 26.0–34.0)
MCHC: 33.4 g/dL (ref 32.0–36.0)
MCV: 88.8 fL (ref 80.0–100.0)
Monocytes Absolute: 0.6 10*3/uL (ref 0.2–1.0)
Monocytes Relative: 9 %
Neutro Abs: 3.8 10*3/uL (ref 1.4–6.5)
Neutrophils Relative %: 59 %
Platelets: 184 10*3/uL (ref 150–440)
RBC: 4.74 MIL/uL (ref 4.40–5.90)
RDW: 14.1 % (ref 11.5–14.5)
WBC: 6.4 10*3/uL (ref 3.8–10.6)

## 2017-10-04 LAB — COMPREHENSIVE METABOLIC PANEL
ALT: 19 U/L (ref 0–44)
AST: 21 U/L (ref 15–41)
Albumin: 4 g/dL (ref 3.5–5.0)
Alkaline Phosphatase: 71 U/L (ref 38–126)
Anion gap: 8 (ref 5–15)
BUN: 19 mg/dL (ref 6–20)
CO2: 23 mmol/L (ref 22–32)
Calcium: 9 mg/dL (ref 8.9–10.3)
Chloride: 107 mmol/L (ref 98–111)
Creatinine, Ser: 1.12 mg/dL (ref 0.61–1.24)
GFR calc Af Amer: >60 mL/min (ref >60)
GFR calc non Af Amer: >60 mL/min (ref >60)
Glucose, Bld: 93 mg/dL (ref 70–99)
Potassium: 4 mmol/L (ref 3.5–5.1)
Sodium: 138 mmol/L (ref 135–145)
Total Bilirubin: 0.5 mg/dL (ref 0.3–1.2)
Total Protein: 6.8 g/dL (ref 6.5–8.1)

## 2017-10-04 LAB — TSH: TSH: 1.16 u[IU]/mL (ref 0.350–4.500)

## 2017-10-04 LAB — PSA: Prostatic Specific Antigen: 0.67 ng/mL (ref 0.00–4.00)

## 2017-10-04 MED ORDER — BUPROPION HCL ER (SR) 150 MG PO TB12: ORAL_TABLET | ORAL | 0 refills | Status: DC

## 2017-10-04 NOTE — Progress Notes (Signed)
Patient: Keith Mendez, Male    DOB: Jul 27, 1955, 62 y.o.   MRN: [id number] Visit Date: 10/04/2017  Today's Provider: Trinna Post, PA-C   Chief Complaint  Patient presents with  . Annual Exam  . Nicotine Dependence   Subjective:    Annual physical exam Keith Mendez is a 62 y.o. male who presents today for health maintenance and complete physical. He feels well. He reports not exercising. He reports he is sleeping well. Works in [employer], [employer]. Also works at [employer] in [place].  Does not know much of family history due to being estranged. Think he may have had an uncle with colon cancer at age 76. Does not think anybody had prostate cancer.  -----------------------------------------------------------------   Review of Systems  Constitutional: Negative.   HENT: Negative.   Eyes: Negative.   Respiratory: Positive for cough. Negative for apnea, choking, chest tightness, shortness of breath, wheezing and stridor.   Cardiovascular: Negative.   Gastrointestinal: Negative.   Endocrine: Negative.   Genitourinary: Negative.   Musculoskeletal: Negative.   Skin: Negative.   Allergic/Immunologic: Negative.   Neurological: Negative.   Hematological: Negative.   Psychiatric/Behavioral: Negative.     Social History      He  reports that he has been smoking cigarettes.  He has been smoking about 0.50 packs per day. He has never used smokeless tobacco. He reports that he drinks alcohol. He reports that he has current or past drug history. Drug: Other-see comments.       Social History   Socioeconomic History  . Marital status: Married    Spouse name: Not on file  . Number of children: Not on file  . Years of education: Not on file  . Highest education level: Not on file  Occupational History  . Not on file  Social Needs  . Financial resource strain: Not on file  . Food insecurity:    Worry: Not on file    Inability: Not on file  .  Transportation needs:    Medical: Not on file    Non-medical: Not on file  Tobacco Use  . Smoking status: Current Every Day Smoker    Packs/day: 0.50    Types: Cigarettes  . Smokeless tobacco: Never Used  Substance and Sexual Activity  . Alcohol use: Yes    Alcohol/week: 0.0 oz    Comment: occasional  . Drug use: Yes    Types: Other-see comments    Comment: previously used steroids  . Sexual activity: Not on file  Lifestyle  . Physical activity:    Days per week: Not on file    Minutes per session: Not on file  . Stress: Not on file  Relationships  . Social connections:    Talks on phone: Not on file    Gets together: Not on file    Attends religious service: Not on file    Active member of club or organization: Not on file    Attends meetings of clubs or organizations: Not on file    Relationship status: Not on file  Other Topics Concern  . Not on file  Social History Narrative  . Not on file    Past Medical History:  Diagnosis Date  . Cervicalgia   . Chronic kidney disease 09/04/2007  . Herpes zoster   . Lumbago      Patient Active Problem List   Diagnosis Date Noted  . Herpes zoster without complication 32/54/9826  .  LBP (low back pain) 08/13/2007  . Compulsive tobacco user syndrome 07/12/2007    Past Surgical History:  Procedure Laterality Date  . NO PAST SURGERIES      Family History        No family status information on file.        His He was adopted. Family history is unknown by patient.      No Known Allergies   Current Outpatient Medications:  .  buPROPion (WELLBUTRIN SR) 150 MG 12 hr tablet, Start at one pill daily for at least a week. (Patient not taking: Reported on 08/30/2017), Disp: 60 tablet, Rfl: 2 .  meloxicam (MOBIC) 7.5 MG tablet, Take 1 to 2 tablets daily PRN for pain. (Patient not taking: Reported on 08/30/2017), Disp: 30 tablet, Rfl: 0 .  promethazine (PHENERGAN) 12.5 MG tablet, Take 1 tablet (12.5 mg total) by mouth every 6  (six) hours as needed for nausea or vomiting. (Patient not taking: Reported on 07/26/2016), Disp: 30 tablet, Rfl: 0   Patient Care Team: Paulene Floor as PCP - General (Physician Assistant)      Objective:   Vitals: BP 122/84 (BP Location: Right Arm, Patient Position: Sitting, Cuff Size: Normal)   Pulse 76   Temp 98.4 F (36.9 C) (Oral)   Resp 16   Ht _0  (1.88 m)   Wt 190 lb (86.2 kg)   BMI 24.39 kg/m    Vitals:   10/04/17 1025  BP: 122/84  Pulse: 76  Resp: 16  Temp: 98.4 F (36.9 C)  TempSrc: Oral  Weight: 190 lb (86.2 kg)  Height: _1  (1.88 m)     Physical Exam  Constitutional: He is oriented to person, place, and time. He appears well-developed and well-nourished.  HENT:  Right Ear: External ear normal.  Left Ear: External ear normal.  Eyes: EOM are normal.  Neck: Neck supple.  Cardiovascular: Normal rate and regular rhythm.  Pulmonary/Chest: Effort normal and breath sounds normal.  Abdominal: Soft. Bowel sounds are normal.  Lymphadenopathy:    He has no cervical adenopathy.  Neurological: He is alert and oriented to person, place, and time.  Skin: Skin is warm and dry.  Psychiatric: He has a normal mood and affect. His behavior is normal.     Depression Screen PHQ 2/9 Scores 10/04/2017  PHQ - 2 Score 0  PHQ- 9 Score 0      Assessment & Plan:     Routine Health Maintenance and Physical Exam  Exercise Activities and Dietary recommendations Goals    None      Immunization History  Administered Date(s) Administered  . MMR 04/24/2003  . Tdap 12/08/2014    Health Maintenance  Topic Date Due  . Hepatitis C Screening  April 14, 1955  . HIV Screening  11/27/1971  . COLONOSCOPY  11/27/2006  . INFLUENZA VACCINE  11/09/2017  . TETANUS/TDAP  12/07/2024     Discussed health benefits of physical activity, and encouraged him to engage in regular exercise appropriate for his age and condition.    1. Annual physical exam   2. Tobacco  abuse  I have counseled patient between 3-10 minutes on smoking cessation. Will try wellbutrin. May try chantix if this is not working.  - buPROPion (WELLBUTRIN SR) 150 MG 12 hr tablet; Take one pill once daily for three days. On day four, take on pill twice daily  Dispense: 180 tablet; Refill: 0  3. Diabetes mellitus screening  - Comprehensive Metabolic Panel (CMET)  4. Encounter for hepatitis C screening test for low risk patient  - Hepatitis c antibody (reflex)  5. Lipid screening  - Lipid Profile  6. Prostate cancer screening  - PSA  7. Encounter for screening for HIV  - HIV antibody (with reflex)  8. Screening for deficiency anemia  - CBC with Differential  9. Thyroid disorder screening  - TSH  Return in about 3 months (around 01/04/2018) for smoking cessation.  The entirety of the information documented in the History of Present Illness, Review of Systems and Physical Exam were personally obtained by me. Portions of this information were initially documented by Ashley Royalty, CMA and reviewed by me for thoroughness and accuracy.   --------------------------------------------------------------------    Trinna Post, PA-C  [facility]

## 2017-10-05 LAB — HIV ANTIBODY (ROUTINE TESTING W REFLEX): HIV Screen 4th Generation wRfx: NONREACTIVE

## 2017-10-05 LAB — HEPATITIS C ANTIBODY: HCV Ab: <0.1 s/co ratio (ref 0.0–0.9)

## 2017-10-19 ENCOUNTER — Encounter: Payer: Self-pay | Admitting: Physician Assistant

## 2017-11-17 ENCOUNTER — Encounter: Payer: 59 | Admitting: Physician Assistant

## 2018-01-15 ENCOUNTER — Ambulatory Visit: Payer: 59 | Admitting: Physician Assistant

## 2018-01-17 ENCOUNTER — Ambulatory Visit (INDEPENDENT_AMBULATORY_CARE_PROVIDER_SITE_OTHER): Payer: 59 | Admitting: Physician Assistant

## 2018-01-17 ENCOUNTER — Encounter: Payer: Self-pay | Admitting: Physician Assistant

## 2018-01-17 VITALS — BP 110/80 | HR 70 | Temp 98.4°F | Resp 16 | Ht 74.0 in | Wt 196.2 lb

## 2018-01-17 DIAGNOSIS — Z720 Tobacco use: Secondary | ICD-10-CM

## 2018-01-17 MED ORDER — BUPROPION HCL ER (SR) 150 MG PO TB12: ORAL_TABLET | ORAL | 0 refills | Status: DC

## 2018-01-17 NOTE — Progress Notes (Signed)
       Patient: Keith Mendez Male    DOB: 23-Sep-1955   62 y.o.   MRN: [id number] Visit Date: 01/17/2018  Today's Provider: Trey Sailors, PA-C   Chief Complaint  Patient presents with  . Follow-up   Subjective:    HPI  Follow up for smoking cessation  The patient was last seen for this 3 months ago. Changes made at last visit include start bupropion 150 mg BID.  He reports excellent compliance with treatment. He feels that condition is Improved. He is not having side effects.   Decreased smoking from one pack daily to half pack daily. He has been smoking 1 pack a day x 40 years.  His CVD risk is elevated with his current smoking habit however if he quits smoking, he will not need treatment for his cholesterol.  ------------------------------------------------------------------------------------     No Known Allergies   Current Outpatient Medications:  .  buPROPion (WELLBUTRIN SR) 150 MG 12 hr tablet, Take one pill once daily for three days. On day four, take on pill twice daily, Disp: 180 tablet, Rfl: 0  Review of Systems  Social History   Tobacco Use  . Smoking status: Current Every Day Smoker    Packs/day: 0.50    Types: Cigarettes  . Smokeless tobacco: Never Used  Substance Use Topics  . Alcohol use: Yes    Alcohol/week: 0.0 standard drinks    Comment: occasional   Objective:   BP 110/80 (BP Location: Left Arm, Patient Position: Sitting, Cuff Size: Large)   Pulse 70   Temp 98.4 F (36.9 C) (Oral)   Resp 16   Ht 6\' 2"  (1.88 m)   Wt 196 lb 3.2 oz (89 kg)   SpO2 97%   BMI 25.19 kg/m  Vitals:   01/17/18 1631  BP: 110/80  Pulse: 70  Resp: 16  Temp: 98.4 F (36.9 C)  TempSrc: Oral  SpO2: 97%  Weight: 196 lb 3.2 oz (89 kg)  Height: 6\' 2"  (1.88 m)     Physical Exam  Constitutional: He is oriented to person, place, and time. He appears well-developed and well-nourished.  Cardiovascular: Normal rate and regular rhythm.  Pulmonary/Chest:  Effort normal. He has wheezes.  Neurological: He is alert and oriented to person, place, and time.  Skin: Skin is warm and dry.  Psychiatric: He has a normal mood and affect. His behavior is normal.        Assessment & Plan:     1. Tobacco abuse  Doing better on wellbutrin, has cut smoking by half. Qualifies for low dose CT screening and he is agreeable to referral today. Will refill wellbutrin as below. His CVD risk elevated but will be 6.1% if he stops smoking. Continue to emphasize importance of smoking cessation.  - Ambulatory Referral for Lung Cancer Scre - buPROPion (WELLBUTRIN SR) 150 MG 12 hr tablet; Take one pill once daily for three days. On day four, take on pill twice daily  Dispense: 180 tablet; Refill: 0        Trey Sailors, PA-C  [facility]

## 2018-01-17 NOTE — Patient Instructions (Signed)

## 2018-01-19 ENCOUNTER — Telehealth: Payer: Self-pay | Admitting: *Deleted

## 2018-01-19 NOTE — Telephone Encounter (Signed)
Received referral for low dose lung cancer screening CT scan.  Unable to leave message at this time, will attempt call at later point.       

## 2018-01-22 ENCOUNTER — Encounter: Payer: Self-pay | Admitting: *Deleted

## 2018-01-22 ENCOUNTER — Telehealth: Payer: Self-pay | Admitting: *Deleted

## 2018-01-22 DIAGNOSIS — Z122 Encounter for screening for malignant neoplasm of respiratory organs: Secondary | ICD-10-CM

## 2018-01-22 NOTE — Telephone Encounter (Signed)
Received a referral for initial lung cancer screening scan.  Contacted the patient and obtained their smoking history, current smoker 43 years 0.5-1ppd with 32.25 pkyr  history   as well as answering questions related to screening process.  Patient denies signs of lung cancer such as weight loss or hemoptysis at this time.  Patient denies comorbidity that would prevent curative treatment if lung cancer were found.  Patient is scheduled for the Shared Decision Making Visit and CT scan on 02-07-18@1530  .

## 2018-02-07 ENCOUNTER — Ambulatory Visit
Admission: RE | Admit: 2018-02-07 | Discharge: 2018-02-07 | Disposition: A | Discharge status: Home / Self Care | Payer: 59 | Source: Ambulatory Visit | Attending: Oncology | Admitting: Oncology

## 2018-02-07 ENCOUNTER — Inpatient Hospital Stay: Payer: 59 | Attending: Oncology | Admitting: Oncology

## 2018-02-07 DIAGNOSIS — Z87891 Personal history of nicotine dependence: Secondary | ICD-10-CM

## 2018-02-07 DIAGNOSIS — I2510 Atherosclerotic heart disease of native coronary artery without angina pectoris: Secondary | ICD-10-CM | POA: Diagnosis not present

## 2018-02-07 DIAGNOSIS — J439 Emphysema, unspecified: Secondary | ICD-10-CM | POA: Diagnosis not present

## 2018-02-07 DIAGNOSIS — Z122 Encounter for screening for malignant neoplasm of respiratory organs: Secondary | ICD-10-CM | POA: Diagnosis not present

## 2018-02-07 DIAGNOSIS — F17210 Nicotine dependence, cigarettes, uncomplicated: Secondary | ICD-10-CM | POA: Diagnosis not present

## 2018-02-07 NOTE — Progress Notes (Signed)
In accordance with CMS guidelines, patient has met eligibility criteria including age, absence of signs or symptoms of lung cancer.  Social History   Tobacco Use  . Smoking status: Current Every Day Smoker    Packs/day: 0.75    Years: 43.00    Pack years: 32.25    Types: Cigarettes  . Smokeless tobacco: Never Used  Substance Use Topics  . Alcohol use: Yes    Alcohol/week: 0.0 standard drinks    Comment: occasional  . Drug use: Yes    Types: Other-see comments    Comment: previously used steroids     A shared decision-making session was conducted prior to the performance of CT scan. This includes one or more decision aids, includes benefits and harms of screening, follow-up diagnostic testing, over-diagnosis, false positive rate, and total radiation exposure.  Counseling on the importance of adherence to annual lung cancer LDCT screening, impact of co-morbidities, and ability or willingness to undergo diagnosis and treatment is imperative for compliance of the program.  Counseling on the importance of continued smoking cessation for former smokers; the importance of smoking cessation for current smokers, and information about tobacco cessation interventions have been given to patient including [place] and [facility] quit [place] programs.  Written order for lung cancer screening with LDCT has been given to the patient and any and all questions have been answered to the best of my abilities.   Yearly follow up will be coordinated by Burgess Estelle, Thoracic Navigator.  Faythe Casa, NP 02/07/2018 3:57 PM

## 2018-02-08 ENCOUNTER — Telehealth: Payer: Self-pay | Admitting: *Deleted

## 2018-02-08 NOTE — Telephone Encounter (Signed)
Notified patient of LDCT lung cancer screening program results with recommendation for 12 month follow up imaging. Also notified of incidental findings noted below and is encouraged to discuss further with PCP who will receive a copy of this note and/or the CT report. Patient verbalizes understanding.   IMPRESSION: 1. Lung-RADS 1, negative. Continue annual screening with low-dose chest CT without contrast in 12 months. 2.  Emphysema (ICD10-J43.9). 3. Distal right coronary artery atherosclerosis.

## 2018-02-09 ENCOUNTER — Other Ambulatory Visit: Payer: Self-pay | Admitting: Physician Assistant

## 2018-02-09 DIAGNOSIS — J439 Emphysema, unspecified: Secondary | ICD-10-CM | POA: Insufficient documentation

## 2018-02-09 DIAGNOSIS — I2510 Atherosclerotic heart disease of native coronary artery without angina pectoris: Secondary | ICD-10-CM

## 2018-02-09 NOTE — Telephone Encounter (Signed)
Patient with hardening of coronary arteries on CT scan. Recommend statin drug. Please have him schedule appointment if he is interested in starting.

## 2018-02-16 ENCOUNTER — Ambulatory Visit: Payer: 59 | Admitting: Physician Assistant

## 2018-02-16 ENCOUNTER — Encounter: Payer: Self-pay | Admitting: Physician Assistant

## 2018-02-16 VITALS — BP 100/70 | HR 58 | Temp 98.4°F | Resp 16 | Wt 193.0 lb

## 2018-02-16 DIAGNOSIS — F17200 Nicotine dependence, unspecified, uncomplicated: Secondary | ICD-10-CM | POA: Diagnosis not present

## 2018-02-16 DIAGNOSIS — J439 Emphysema, unspecified: Secondary | ICD-10-CM | POA: Diagnosis not present

## 2018-02-16 DIAGNOSIS — I2510 Atherosclerotic heart disease of native coronary artery without angina pectoris: Secondary | ICD-10-CM | POA: Diagnosis not present

## 2018-02-16 MED ORDER — ATORVASTATIN CALCIUM 10 MG PO TABS: 10.0000 mg | ORAL_TABLET | Freq: Every day | ORAL | 0 refills | Status: DC

## 2018-02-16 NOTE — Progress Notes (Signed)
       Patient: Keith Mendez Male    DOB: Feb 02, 1956   62 y.o.   MRN: [id number] Visit Date: 02/20/2018  Today's Provider: Trey Sailors, PA-C   Chief Complaint  Patient presents with  . Follow-up   Subjective:    HPI   Patient here today to follow up on CT scan results. Low dose chest CT 02/08/2018 showed no suspicious nodules, pulmonary emphysema and distal right coronary artery atherosclerosis. Here today to discuss cholesterol medication. Reports he is still smoking, was able to cut down for a time with Wellbutrin but then got stressed and started smoking again. Smokes 1/3 - 3/4 pack per day. Denies chest pain, claudication.      No Known Allergies   Current Outpatient Medications:  .  buPROPion (WELLBUTRIN SR) 150 MG 12 hr tablet, Take one pill once daily for three days. On day four, take on pill twice daily, Disp: 180 tablet, Rfl: 0 .  atorvastatin (LIPITOR) 10 MG tablet, Take 1 tablet (10 mg total) by mouth daily., Disp: 90 tablet, Rfl: 0  Review of Systems  Respiratory: Negative.   Cardiovascular: Negative.     Social History   Tobacco Use  . Smoking status: Current Every Day Smoker    Packs/day: 0.75    Years: 43.00    Pack years: 32.25    Types: Cigarettes  . Smokeless tobacco: Never Used  Substance Use Topics  . Alcohol use: Yes    Alcohol/week: 0.0 standard drinks    Comment: occasional   Objective:   BP 100/70 (BP Location: Left Arm, Patient Position: Sitting, Cuff Size: Normal)   Pulse (!) 58   Temp 98.4 F (36.9 C) (Oral)   Resp 16   Wt 193 lb (87.5 kg)   SpO2 97%   BMI 24.78 kg/m  Vitals:   02/16/18 1203  BP: 100/70  Pulse: (!) 58  Resp: 16  Temp: 98.4 F (36.9 C)  TempSrc: Oral  SpO2: 97%  Weight: 193 lb (87.5 kg)     Physical Exam  Constitutional: He is oriented to person, place, and time. He appears well-developed and well-nourished.  Cardiovascular: Normal rate and regular rhythm.  Pulmonary/Chest: Effort normal and  breath sounds normal.  Neurological: He is alert and oriented to person, place, and time.  Skin: Skin is warm and dry.  Psychiatric: He has a normal mood and affect. His behavior is normal.        Assessment & Plan:     1. Coronary artery disease involving native coronary artery of native heart without angina pectoris  Start on statin as below. See him back in a couple of months for labwork and compliance follow up.   - atorvastatin (LIPITOR) 10 MG tablet; Take 1 tablet (10 mg total) by mouth daily.  Dispense: 90 tablet; Refill: 0  2. Pulmonary emphysema, unspecified emphysema type (HCC)  Counseled on importance of quitting smoking. Continue wellbutrin.  3. Compulsive tobacco user syndrome  Return in about 2 months (around 04/18/2018) for cholesterol .  The entirety of the information documented in the History of Present Illness, Review of Systems and Physical Exam were personally obtained by me. Portions of this information were initially documented by Rondel Baton, CMA and reviewed by me for thoroughness and accuracy.          Trey Sailors, PA-C  [facility]

## 2018-02-16 NOTE — Patient Instructions (Signed)
Atorvastatin tablets °What is this medicine? °ATORVASTATIN (a TORE va sta tin) is known as a HMG-CoA reductase inhibitor or 'statin'. It lowers the level of cholesterol and triglycerides in the blood. This drug may also reduce the risk of heart attack, stroke, or other health problems in patients with risk factors for heart disease. Diet and lifestyle changes are often used with this drug. °This medicine may be used for other purposes; ask your health care provider or pharmacist if you have questions. °COMMON BRAND NAME(S): Lipitor °What should I tell my health care provider before I take this medicine? °They need to know if you have any of these conditions: °-frequently drink alcoholic beverages °-history of stroke, TIA °-kidney disease °-liver disease °-muscle aches or weakness °-other medical condition °-an unusual or allergic reaction to atorvastatin, other medicines, foods, dyes, or preservatives °-pregnant or trying to get pregnant °-breast-feeding °How should I use this medicine? °Take this medicine by mouth with a glass of water. Follow the directions on the prescription label. You can take this medicine with or without food. Take your doses at regular intervals. Do not take your medicine more often than directed. °Talk to your pediatrician regarding the use of this medicine in children. While this drug may be prescribed for children as young as 10 years old for selected conditions, precautions do apply. °Overdosage: If you think you have taken too much of this medicine contact a poison control center or emergency room at once. °NOTE: This medicine is only for you. Do not share this medicine with others. °What if I miss a dose? °If you miss a dose, take it as soon as you can. If it is almost time for your next dose, take only that dose. Do not take double or extra doses. °What may interact with this medicine? °Do not take this medicine with any of the following medications: °-red yeast  rice °-telaprevir °-telithromycin °-voriconazole °This medicine may also interact with the following medications: °-alcohol °-antiviral medicines for HIV or AIDS °-boceprevir °-certain antibiotics like clarithromycin, erythromycin, troleandomycin °-certain medicines for cholesterol like fenofibrate or gemfibrozil °-cimetidine °-clarithromycin °-colchicine °-cyclosporine °-digoxin °-male hormones, like estrogens or progestins and birth control pills °-grapefruit juice °-medicines for fungal infections like fluconazole, itraconazole, ketoconazole °-niacin °-rifampin °-spironolactone °This list may not describe all possible interactions. Give your health care provider a list of all the medicines, herbs, non-prescription drugs, or dietary supplements you use. Also tell them if you smoke, drink alcohol, or use illegal drugs. Some items may interact with your medicine. °What should I watch for while using this medicine? °Visit your doctor or health care professional for regular check-ups. You may need regular tests to make sure your liver is working properly. °Tell your doctor or health care professional right away if you get any unexplained muscle pain, tenderness, or weakness, especially if you also have a fever and tiredness. Your doctor or health care professional may tell you to stop taking this medicine if you develop muscle problems. If your muscle problems do not go away after stopping this medicine, contact your health care professional. °This drug is only part of a total heart-health program. Your doctor or a dietician can suggest a low-cholesterol and low-fat diet to help. Avoid alcohol and smoking, and keep a proper exercise schedule. °Do not use this drug if you are pregnant or breast-feeding. Serious side effects to an unborn child or to an infant are possible. Talk to your doctor or pharmacist for more information. °This medicine may affect   blood sugar levels. If you have diabetes, check with your doctor  or health care professional before you change your diet or the dose of your diabetic medicine. °If you are going to have surgery tell your health care professional that you are taking this drug. °What side effects may I notice from receiving this medicine? °Side effects that you should report to your doctor or health care professional as soon as possible: °-allergic reactions like skin rash, itching or hives, swelling of the face, lips, or tongue °-dark urine °-fever °-joint pain °-muscle cramps, pain °-redness, blistering, peeling or loosening of the skin, including inside the mouth °-trouble passing urine or change in the amount of urine °-unusually weak or tired °-yellowing of eyes or skin °Side effects that usually do not require medical attention (report to your doctor or health care professional if they continue or are bothersome): °-constipation °-heartburn °-stomach gas, pain, upset °This list may not describe all possible side effects. Call your doctor for medical advice about side effects. You may report side effects to FDA at 1-800-FDA-1088. °Where should I keep my medicine? °Keep out of the reach of children. °Store at room temperature between 20 to 25 degrees C (68 to 77 degrees F). Throw away any unused medicine after the expiration date. °NOTE: This sheet is a summary. It may not cover all possible information. If you have questions about this medicine, talk to your doctor, pharmacist, or health care provider. °© 2018 [employer] (2011-02-15 09:18:24) ° °

## 2018-02-21 ENCOUNTER — Ambulatory Visit: Payer: 59 | Admitting: Family Medicine

## 2018-04-16 ENCOUNTER — Ambulatory Visit: Payer: 59 | Admitting: Physician Assistant

## 2018-04-16 ENCOUNTER — Encounter: Payer: Self-pay | Admitting: Physician Assistant

## 2018-04-16 VITALS — BP 122/78 | HR 73 | Temp 98.8°F | Wt 197.4 lb

## 2018-04-16 DIAGNOSIS — E785 Hyperlipidemia, unspecified: Secondary | ICD-10-CM | POA: Diagnosis not present

## 2018-04-16 DIAGNOSIS — Z716 Tobacco abuse counseling: Secondary | ICD-10-CM

## 2018-04-16 DIAGNOSIS — Z23 Encounter for immunization: Secondary | ICD-10-CM | POA: Diagnosis not present

## 2018-04-16 MED ORDER — NICOTINE 7 MG/24HR TD PT24: 7.0000 mg | MEDICATED_PATCH | Freq: Every day | TRANSDERMAL | 0 refills | Status: DC

## 2018-04-16 MED ORDER — ROSUVASTATIN CALCIUM 5 MG PO TABS: 5.0000 mg | ORAL_TABLET | Freq: Every day | ORAL | 0 refills | Status: DC

## 2018-04-16 MED ORDER — NICOTINE 21 MG/24HR TD PT24: 21.0000 mg | MEDICATED_PATCH | Freq: Every day | TRANSDERMAL | 0 refills | Status: DC

## 2018-04-16 MED ORDER — NICOTINE 14 MG/24HR TD PT24: 14.0000 mg | MEDICATED_PATCH | Freq: Every day | TRANSDERMAL | 0 refills | Status: DC

## 2018-04-16 NOTE — Progress Notes (Signed)
Patient: Keith Mendez Male    DOB: [birth year]   63 y.o.   MRN: [id number] Visit Date: 04/19/2018  Today's Provider: Trey Sailors, PA-C   Chief Complaint  Patient presents with  . Hyperlipidemia   Subjective:    HPI  Lipid/Cholesterol, Follow-up:   Last seen for this 2  months ago.  Management changes since that visit include: start atorvastatin (LIPITOR) 10 MG tablet. He says he took it for 2 days and developed a rash that looked like bites on one side of his eyes. The rash was on his temple area. He denies rash on eyelids, hands. Denies muscle aches. He says he stopped the medication and the rash went away and he hasn't taken it since then.  . Last Lipid Panel:    Component Value Date/Time   CHOL 174 10/04/2017 1131   TRIG 50 10/04/2017 1131   HDL 54 10/04/2017 1131   CHOLHDL 3.2 10/04/2017 1131   VLDL 10 10/04/2017 1131   LDLCALC 110 (H) 10/04/2017 1131    Risk factors for vascular disease include hypercholesterolemia and smoking  He reports poor compliance with treatment. He is having side effects was a rash.  Current symptoms include none and have been resolved. Weight trend: stable Prior visit with dietician: no Current diet: well balanced Current exercise: none  Wt Readings from Last 3 Encounters:  04/16/18 197 lb 6.4 oz (89.5 kg)  02/16/18 193 lb (87.5 kg)  02/07/18 196 lb (88.9 kg)   Tobacco Cessation: Patient is currently taking wellbutrin but continues to smoke >.5 packs per day. He has emphysema on recent low dose CT chest but otherwise no suspicious pulmonary nodules.  -------------------------------------------------------------------   No Known Allergies   Current Outpatient Medications:  .  atorvastatin (LIPITOR) 10 MG tablet, Take 1 tablet (10 mg total) by mouth daily. (Patient not taking: Reported on 04/16/2018), Disp: 90 tablet, Rfl: 0 .  buPROPion (WELLBUTRIN SR) 150 MG 12 hr tablet, Take one pill once daily for three days. On day  four, take on pill twice daily (Patient not taking: Reported on 04/16/2018), Disp: 180 tablet, Rfl: 0 .  nicotine (NICODERM CQ) 14 mg/24hr patch, Place 1 patch (14 mg total) onto the skin daily., Disp: 14 patch, Rfl: 0 .  nicotine (NICODERM CQ) 21 mg/24hr patch, Place 1 patch (21 mg total) onto the skin daily., Disp: 42 patch, Rfl: 0 .  nicotine (NICODERM CQ) 7 mg/24hr patch, Place 1 patch (7 mg total) onto the skin daily., Disp: 14 patch, Rfl: 0 .  rosuvastatin (CRESTOR) 5 MG tablet, Take 1 tablet (5 mg total) by mouth daily., Disp: 90 tablet, Rfl: 0  Review of Systems  Constitutional: Negative.   HENT: Negative.   Respiratory: Negative.   Allergic/Immunologic: Negative.   Neurological: Negative.     Social History   Tobacco Use  . Smoking status: Current Every Day Smoker    Packs/day: 0.75    Years: 43.00    Pack years: 32.25    Types: Cigarettes  . Smokeless tobacco: Never Used  Substance Use Topics  . Alcohol use: Yes    Alcohol/week: 0.0 standard drinks    Comment: occasional      Objective:   BP 122/78 (BP Location: Right Arm, Patient Position: Sitting, Cuff Size: Normal)   Pulse 73   Temp 98.8 F (37.1 C) (Oral)   Wt 197 lb 6.4 oz (89.5 kg)   BMI 25.34 kg/m  Vitals:   04/16/18 1524  BP: 122/78  Pulse: 73  Temp: 98.8 F (37.1 C)  TempSrc: Oral  Weight: 197 lb 6.4 oz (89.5 kg)     Physical Exam Constitutional:      Appearance: Normal appearance.  Cardiovascular:     Rate and Rhythm: Normal rate and regular rhythm.     Heart sounds: Normal heart sounds.  Pulmonary:     Breath sounds: Normal breath sounds.  Skin:    General: Skin is warm and dry.  Neurological:     General: No focal deficit present.     Mental Status: He is alert and oriented to person, place, and time.  Psychiatric:        Mood and Affect: Mood normal.        Behavior: Behavior normal.         Assessment & Plan    1. Hyperlipidemia, unspecified hyperlipidemia type  I do not  think his rash was a side effect of the medication. It was very focal and does not seem to align with dermatomyositis. I think he should restart statin especially considering his smoking. If he does stop smoking he may be able to stop statin.   - rosuvastatin (CRESTOR) 5 MG tablet; Take 1 tablet (5 mg total) by mouth daily.  Dispense: 90 tablet; Refill: 0  2. Encounter for tobacco use cessation counseling  Counseled > 3 minutes.   - nicotine (NICODERM CQ) 21 mg/24hr patch; Place 1 patch (21 mg total) onto the skin daily.  Dispense: 42 patch; Refill: 0 - nicotine (NICODERM CQ) 14 mg/24hr patch; Place 1 patch (14 mg total) onto the skin daily.  Dispense: 14 patch; Refill: 0 - nicotine (NICODERM CQ) 7 mg/24hr patch; Place 1 patch (7 mg total) onto the skin daily.  Dispense: 14 patch; Refill: 0  3. Need for influenza vaccination  - Flu Vaccine QUAD 36+ mos IM  4. Need for 23-polyvalent pneumococcal polysaccharide vaccine  Indicated for COPD and smoking status.   - Pneumococcal polysaccharide vaccine 23-valent greater than or equal to 2yo subcutaneous/IM  Return in about 3 months (around 07/16/2018) for HLD.  The entirety of the information documented in the History of Present Illness, Review of Systems and Physical Exam were personally obtained by me. Portions of this information were initially documented by Rondel BatonSulibeya Dimas, CMA and reviewed by me for thoroughness and accuracy.      Trey SailorsAdriana M Dantonio Justen, PA-C  [facility]

## 2018-04-18 NOTE — Patient Instructions (Signed)
Tobacco Use Disorder Tobacco use disorder (TUD) occurs when a person craves, seeks, and uses tobacco, regardless of the consequences. This disorder can cause problems with mental and physical health. It can affect your ability to have healthy relationships, and it can keep you from meeting your responsibilities at work, home, or school. Tobacco may be:  Smoked as a cigarette or cigar.  Inhaled using e-cigarettes.  Smoked in a pipe or hookah.  Chewed as smokeless tobacco.  Inhaled into the nostrils as snuff. Tobacco products contain a dangerous chemical called nicotine, which is very addictive. Nicotine triggers hormones that make the body feel stimulated and works on areas of the brain that make you feel good. These effects can make it hard for people to quit nicotine. Tobacco contains many other unsafe chemicals that can damage almost every organ in the body. Smoking tobacco also puts others in danger due to fire risk and possible health problems caused by breathing in secondhand smoke. What are the signs or symptoms? Symptoms of TUD may include:  Being unable to slow down or stop your tobacco use.  Spending an abnormal amount of time getting or using tobacco.  Craving tobacco. Cravings may last for up to 6 months after quitting.  Tobacco use that: ? Interferes with your work, school, or home life. ? Interferes with your personal and social relationships. ? Makes you give up activities that you once enjoyed or found important.  Using tobacco even though you know that it is: ? Dangerous or bad for your health or someone else's health. ? Causing problems in your life.  Needing more and more of the substance to get the same effect (developing tolerance).  Experiencing unpleasant symptoms if you do not use the substance (withdrawal). Withdrawal symptoms may include: ? Depressed, anxious, or irritable mood. ? Difficulty concentrating. ? Increased appetite. ? Restlessness or trouble  sleeping.  Using the substance to avoid withdrawal. How is this diagnosed? This condition may be diagnosed based on:  Your current and past tobacco use. Your health care provider may ask questions about how your tobacco use affects your life.  A physical exam. You may be diagnosed with TUD if you have at least two symptoms within a 12-month period. How is this treated? This condition is treated by stopping tobacco use. Many people are unable to quit on their own and need help. Treatment may include:  Nicotine replacement therapy (NRT). NRT provides nicotine without the other harmful chemicals in tobacco. NRT gradually lowers the dosage of nicotine in the body and reduces withdrawal symptoms. NRT is available as: ? Over-the-counter gums, lozenges, and skin patches. ? Prescription mouth inhalers and nasal sprays.  Medicine that acts on the brain to reduce cravings and withdrawal symptoms.  A type of talk therapy that examines your triggers for tobacco use, how to avoid them, and how to cope with cravings (behavioral therapy).  Hypnosis. This may help with withdrawal symptoms.  Joining a support group for others coping with TUD. The best treatment for TUD is usually a combination of medicine, talk therapy, and support groups. Recovery can be a long process. Many people start using tobacco again after stopping (relapse). If you relapse, it does not mean that treatment will not work. Follow these instructions at home:  Lifestyle  Do not use any products that contain nicotine or tobacco, such as cigarettes and e-cigarettes.  Avoid things that trigger tobacco use as much as you can. Triggers include people and situations that usually cause you   to use tobacco.  Avoid drinks that contain caffeine, including coffee. These may worsen some withdrawal symptoms.  Find ways to manage stress. Wanting to smoke may cause stress, and stress can make you want to smoke. Relaxation techniques such as  deep breathing, meditation, and yoga may help.  Attend support groups as needed. These groups are an important part of long-term recovery for many people. General instructions  Take over-the-counter and prescription medicines only as told by your health care provider.  Check with your health care provider before taking any new prescription or over-the-counter medicines.  Decide on a friend, family member, or smoking quit-line (such as 1-800-QUIT-NOW in the [place] that you can call or text when you feel the urge to smoke or when you need help coping with cravings.  Keep all follow-up visits as told by your health care provider and therapist. This is important. Contact a health care provider if:  You are not able to take your medicines as prescribed.  Your symptoms get worse, even with treatment. Summary  Tobacco use disorder (TUD) occurs when a person craves, seeks, and uses tobacco regardless of the consequences.  This condition may be diagnosed based on your current and past tobacco use and a physical exam.  Many people are unable to quit on their own and need help. Recovery can be a long process.  The most effective treatment for TUD is usually a combination of medicine, talk therapy, and support groups. This information is not intended to replace advice given to you by your health care provider. Make sure you discuss any questions you have with your health care provider. Document Released: 12/02/2003 Document Revised: 03/15/2017 Document Reviewed: 03/15/2017 Elsevier Interactive Patient Education  2019 Elsevier Inc.  

## 2018-05-04 ENCOUNTER — Telehealth: Payer: Self-pay | Admitting: Physician Assistant

## 2018-05-04 DIAGNOSIS — Z716 Tobacco abuse counseling: Secondary | ICD-10-CM

## 2018-05-04 NOTE — Telephone Encounter (Signed)
Please review. Thanks!  

## 2018-05-04 NOTE — Telephone Encounter (Signed)
Patient called stating [facility] gave him the wrong mg. Of Nicoderm of patches.  He said he was suppose to get the starting dose and they gave him the ending mg. Patches.     So he is not using [facility] any longer and wants the patches called to [facility] on [address].

## 2018-05-07 MED ORDER — NICOTINE 21 MG/24HR TD PT24: 21.0000 mg | MEDICATED_PATCH | Freq: Every day | TRANSDERMAL | 0 refills | Status: AC

## 2018-05-07 NOTE — Telephone Encounter (Signed)
Patient advised as below.  

## 2018-05-07 NOTE — Telephone Encounter (Signed)
Please send in 21 mg/24 hr patch once daily for smoking cessation to [facility]. Patient can call back in one month for next dose to avoid confusion at the pharmacy. It won't hurt him that he did the 7 mg it just wasn't enough.

## 2018-05-21 ENCOUNTER — Ambulatory Visit: Payer: Self-pay | Admitting: Physician Assistant

## 2018-06-29 ENCOUNTER — Other Ambulatory Visit: Payer: Self-pay

## 2018-06-29 ENCOUNTER — Ambulatory Visit: Payer: 59 | Admitting: Physician Assistant

## 2018-06-29 ENCOUNTER — Encounter: Payer: Self-pay | Admitting: Physician Assistant

## 2018-06-29 VITALS — BP 151/90 | HR 65 | Temp 98.8°F | Resp 16 | Wt 197.0 lb

## 2018-06-29 DIAGNOSIS — A059 Bacterial foodborne intoxication, unspecified: Secondary | ICD-10-CM | POA: Diagnosis not present

## 2018-06-29 NOTE — Patient Instructions (Signed)
Food Poisoning  Food poisoning is an illness that is caused by eating or drinking contaminated foods or drinks. In most cases, food poisoning is mild and lasts 1-2 days. However, some cases can be serious, especially for people who have weak body defense (immune) systems, older people, children and infants, and pregnant women.  What are the causes?  Foods can become contaminated with viruses, bacteria, parasites, mold, or chemicals as a result of:   Poor personal hygiene, such as poor hand washing practices.   Storing food improperly, such as not refrigerating raw meat.   Using unclean surfaces for serving, preparing, and storing food.   Cooking or eating with unclean utensils.  If contaminated food is eaten, viruses, bacteria, or parasites can harm the intestine. This often causes severe diarrhea. The most common causes of food poisoning include:   Viruses, such as:  ? Norovirus.  ? Rotavirus.   Bacteria, such as:  ? Salmonella.  ? Listeria.  ? E. coli (Escherichia coli).   Parasites, such as:  ? Giardia.  ? Toxoplasmosis.  What are the signs or symptoms?  Symptoms may take several hours to appear after you consume contaminated food or drink. Symptoms include:   Nausea.   Vomiting.   Cramping.   Diarrhea.   Fever and chills.   Muscle aches.   Dehydration. Dehydration can cause you to be tired and thirsty, have a dry mouth, and urinate less frequently.  How is this diagnosed?  Your health care provider can diagnose food poisoning with a medical history and physical exam. This will include asking you what you have recently eaten. You may also have tests, including:   Blood tests.   Stool tests.  How is this treated?  Treatment focuses on relieving your symptoms and making sure that you are hydrated. You may also be given medicines. In severe cases, hospitalization may be required and you may need to receive fluids through an IV tube.  Follow these instructions at home:  Eating and drinking     Drink  enough fluids to keep your urine clear or pale yellow. You may need to drink small amounts of clear liquids frequently.   Avoid milk, caffeine, and alcohol.   Ask your health care provider for specific rehydration instructions.   Eat small, frequent meals rather than large meals.  Medicines   Take over-the-counter and prescription medicines only as told by your health care provider. Ask your health care provider if you should continue to take any of your regular prescribed and over-the-counter medicines.   If you were prescribed an antibiotic medicine, take it as told by your health care provider. Do not stop taking the antibiotic even if you start to feel better.  General instructions   Wash your hands thoroughly before you prepare food and after you go to the bathroom (use the toilet). Make sure people who live with you also wash their hands often.   Clean surfaces that you touch with a product that contains chlorine bleach.   Keep all follow-up visits as told by your health care provider. This is important.  How is this prevented?   Wash your hands, food preparation surfaces, and utensils thoroughly before and after you handle raw foods.   Use separate food preparation surfaces and storage spaces for raw meat and for fruits and vegetables.   Keep refrigerated foods colder than 40F (5C).   Serve hot foods immediately or keep them heated above 140F (60C).   Store dry foods   in cool, dry spaces away from excess heat or moisture. Throw out any foods that do not smell right or are in cans that are bulging.   Follow approved canning procedures.   Heat canned foods thoroughly before you taste them.   Drink bottled or sterile water when you travel.  Get help right away if:  Call 911 or go to the emergency room if:   You have difficulty breathing, swallowing, talking, or moving.   You develop blurred vision.   You cannot eat or drink without vomiting.   You faint.   Your eyes turn yellow.   Your  vomiting or diarrhea is persistent.   Abdominal pain develops, increases, or localizes in one small area.   You have a fever.   You have blood or mucus in your stools, or your stools look dark black and tarry.   You have signs of dehydration, such as:  ? Dark urine, very little urine, or no urine.  ? Cracked lips.  ? Not making tears while crying.  ? Dry mouth.  ? Sunken eyes.  ? Sleepiness.  ? Weakness.  ? Dizziness.  This information is not intended to replace advice given to you by your health care provider. Make sure you discuss any questions you have with your health care provider.  Document Released: 12/25/2003 Document Revised: 08/25/2015 Document Reviewed: 09/29/2014  Elsevier Interactive Patient Education  2019 Elsevier Inc[employer]

## 2018-06-29 NOTE — Progress Notes (Signed)
       Patient: Keith Mendez Male    DOB: 1955/09/29   63 y.o.   MRN: [id number] Visit Date: 06/29/2018  Today's Provider: Margaretann Loveless, PA-C   Chief Complaint  Patient presents with  . Emesis   Subjective:     HPI Patient here today c/o possible food poisioning since yesterday. Patient reports he ate left over [place] food. Patient reports vomiting a lot yesterday and last night. Patient denies any vomiting or any other symptoms today. Patient reports he has not had anything to eat today.   Marland KitchenNo Known Allergies   Current Outpatient Medications:  .  nicotine (NICODERM CQ) 14 mg/24hr patch, Place 1 patch (14 mg total) onto the skin daily., Disp: 14 patch, Rfl: 0 .  rosuvastatin (CRESTOR) 5 MG tablet, Take 1 tablet (5 mg total) by mouth daily., Disp: 90 tablet, Rfl: 0 .  nicotine (NICODERM CQ) 7 mg/24hr patch, Place 1 patch (7 mg total) onto the skin daily., Disp: 14 patch, Rfl: 0  Review of Systems  Constitutional: Negative.   Respiratory: Negative.   Cardiovascular: Negative.   Gastrointestinal: Positive for abdominal pain and nausea. Negative for diarrhea and vomiting.  Neurological: Negative.     Social History   Tobacco Use  . Smoking status: Current Every Day Smoker    Packs/day: 0.75    Years: 43.00    Pack years: 32.25    Types: Cigarettes  . Smokeless tobacco: Never Used  Substance Use Topics  . Alcohol use: Yes    Alcohol/week: 0.0 standard drinks    Comment: occasional      Objective:   BP (!) 151/90 (BP Location: Right Arm, Patient Position: Sitting, Cuff Size: Large)   Pulse 65   Temp 98.8 F (37.1 C) (Oral)   Resp 16   Wt 197 lb (89.4 kg)   BMI 25.29 kg/m  Vitals:   06/29/18 1324  BP: (!) 151/90  Pulse: 65  Resp: 16  Temp: 98.8 F (37.1 C)  TempSrc: Oral  Weight: 197 lb (89.4 kg)     Physical Exam Constitutional:      General: He is not in acute distress.    Appearance: Normal appearance. He is well-developed. He is not  diaphoretic.  Cardiovascular:     Rate and Rhythm: Normal rate and regular rhythm.     Heart sounds: Normal heart sounds. No murmur. No friction rub. No gallop.   Pulmonary:     Effort: Pulmonary effort is normal. No respiratory distress.     Breath sounds: Normal breath sounds. No wheezing or rales.  Abdominal:     General: Bowel sounds are normal. There is no distension.     Palpations: Abdomen is soft. There is no mass.     Tenderness: There is no abdominal tenderness. There is no guarding or rebound.  Skin:    General: Skin is warm and dry.  Neurological:     Mental Status: He is alert and oriented to person, place, and time.         Assessment & Plan    1. Food poisoning Suspect food poisoning. Symptoms have now resolved. Discussed slowly increasing diet as tolerated. Start bland (BRAT) diet, clear liquids and increase as tolerated. Call if symptoms worsen or return.      Margaretann Loveless, PA-C  [facility]

## 2018-07-16 ENCOUNTER — Ambulatory Visit: Payer: 59 | Admitting: Physician Assistant

## 2018-07-18 ENCOUNTER — Other Ambulatory Visit: Payer: Self-pay

## 2018-07-18 ENCOUNTER — Encounter: Payer: Self-pay | Admitting: Physician Assistant

## 2018-07-18 ENCOUNTER — Ambulatory Visit: Payer: 59 | Admitting: Physician Assistant

## 2018-07-18 DIAGNOSIS — E785 Hyperlipidemia, unspecified: Secondary | ICD-10-CM | POA: Diagnosis not present

## 2018-07-18 MED ORDER — ROSUVASTATIN CALCIUM 5 MG PO TABS: 5.0000 mg | ORAL_TABLET | Freq: Every day | ORAL | 1 refills | Status: DC

## 2018-07-18 NOTE — Patient Instructions (Signed)
Lipid Profile Test  Why am I having this test?  The lipid profile test can be used to help evaluate your risk for developing heart disease. The test is also used to monitor your levels during treatment for high cholesterol to see if you are reaching your goals.  What is being tested?  A lipid profile measures the following:   Total cholesterol. Cholesterol is a waxy, fat-like substance in your blood. If your total cholesterol level is high, this can increase your risk for heart disease.   High-density lipoprotein (HDL). This is known as the good cholesterol. Having high levels of HDL decreases your risk for heart disease. Your HDL level may be low if you smoke or do not get enough exercise.   Low-density lipoprotein (LDL). This is known as the bad cholesterol. This type causes plaque to build up in your arteries. Having a low level of LDL is best. Having high levels of LDL increases your risk for heart disease.   Cholesterol to HDL ratio. This is calculated by dividing your total cholesterol by your HDL cholesterol. The ratio is used by health care providers to determine your risk for heart disease. A low ratio is best.   Triglycerides. These are fats that your body can store or burn for energy. Low levels are best. Having high levels of triglycerides increases your risk for heart disease.  What kind of sample is taken?    A blood sample is required for this test. It is usually collected by inserting a needle into a blood vessel.  How do I prepare for this test?  Do not drink alcohol starting at least 24 hours before your test.  Follow any instructions from your health care provider about dietary restrictions before your test.  Do not eat or drink anything other than water after midnight on the night before the test, or as told by your health care provider.  Tell a health care provider about:   All medicines you are taking, including vitamins, herbs, eye drops, creams, and over-the-counter medicines.   Any  medical conditions you have.   Whether you are pregnant or may be pregnant.  How are the results reported?  Your test results will be reported as values that indicate your cholesterol and triglyceride levels. Your health care provider will compare your results to normal ranges that were established after testing a large group of people (reference ranges). Reference ranges may vary among labs and hospitals. For this test, common reference ranges are:  Total cholesterol   Adult or elderly: less than 200 mg/dL.   Child: 120-200 mg/dL.   Infant: 70-175 mg/dL.   Newborn: 53-135 mg/dL.  HDL   Male: greater than 45 mg/dL.   Male: greater than 55 mg/dL.  HDL reference values based on your risk for heart disease:   Low risk for heart disease:  ? Male: 60 mg/dL.  ? Male: 70 mg/dL.   Moderate risk for heart disease:  ? Male: 45 mg/dL.  ? Male: 55 mg/dL.   High risk for heart disease:  ? Male: 25 mg/dL.  ? Male: 35 mg/dL.  LDL   Adults: Your health care provider will determine a target level for LDL based on your risk for heart disease.  ? If you are at low risk, your LDL should be 130 mg/dL or less.  ? If you are at moderate risk, your LDL should be 100 mg/dL or less.  ? If you are at high risk, your LDL should   be 70 mg/dL or less.   Children: less than 110 mg/dL.  Cholesterol to HDL ratio  Reference values based on your risk for heart disease:   Risk that is half the average risk:  ? Male: 3.4.  ? Male: 3.3.   Average risk:  ? Male: 5.0.  ? Male: 4.4.   Risk that is two times average (moderate risk):  ? Male: 10.0.  ? Male: 7.0.   Risk that is three times average (high risk):  ? Male: 24.0.  ? Male: 11.0.  Triglycerides   Adult or elderly:  ? Male: 40-160 mg/dL.  ? Male: 35-135 mg/dL.   Children 16-19 years old:  ? Male: 40-163 mg/dL.  ? Male: 40-128 mg/dL.   Children 12-15 years old:  ? Male: 36-138 mg/dL.  ? Male: 41-138 mg/dL.   Children 6-11 years old:  ? Male: 31-108  mg/dL.  ? Male: 35-114 mg/dL.   Children 0-5 years old:  ? Male: 30-86 mg/dL.  ? Male: 32-99 mg/dL.  Triglycerides should be less than 400 mg/dL even when you are not fasting.  What do the results mean?  Results that are within the reference ranges are considered normal. Total cholesterol, LDL, and triglyceride levels that are higher than the reference ranges can mean that you have an increased risk for heart disease. An HDL level that is lower than the reference range can also indicate an increased risk.  Talk with your health care provider about what your results mean.  Questions to ask your health care provider  Ask your health care provider, or the department that is doing the test:   When will my results be ready?   How will I get my results?   What are my treatment options?   What other tests do I need?   What are my next steps?  Summary   The lipid profile test can be used to help predict the likelihood that you will develop heart disease. It can also help monitor your cholesterol levels during treatment.   A lipid profile measures your total cholesterol, high-density lipoprotein (HDL), low-density lipoprotein (LDL), cholesterol to HDL ratio, and triglycerides.   Total cholesterol, LDL, and triglyceride levels that are higher than the reference ranges can indicate an increased risk for heart disease.   An HDL level that is lower than the reference range can indicate an increased risk for heart disease.   Talk with your health care provider about what your results mean.  This information is not intended to replace advice given to you by your health care provider. Make sure you discuss any questions you have with your health care provider.  Document Released: 04/21/2004 Document Revised: 01/02/2017 Document Reviewed: 01/02/2017  Elsevier Interactive Patient Education  2019 Elsevier Inc.

## 2018-07-18 NOTE — Progress Notes (Signed)
Patient: Keith Mendez Male    DOB: August 03, 1955   63 y.o.   MRN: [id number] Visit Date: 07/18/2018  Today's Provider: Trey Sailors, PA-C   Chief Complaint  Patient presents with  . Hyperlipidemia   Subjective:     HPI   HLD: Patient is presenting today for f/u HLD. Previously was started on Lipitor 10 mg for HLD but patient had rash on his eye which he thought was due to the medication so he discontinued it on his own. It was determined that rash was not likely to be from medication and he was started on crestor 5 mg daily.  Lipid Panel     Component Value Date/Time   CHOL 174 10/04/2017 1131   TRIG 50 10/04/2017 1131   HDL 54 10/04/2017 1131   CHOLHDL 3.2 10/04/2017 1131   VLDL 10 10/04/2017 1131   LDLCALC 110 (H) 10/04/2017 1131   Tobacco Abuse: He is currently smoking. Has started taking wellbutrin again to stop this.   No Known Allergies   Current Outpatient Medications:  .  rosuvastatin (CRESTOR) 5 MG tablet, Take 1 tablet (5 mg total) by mouth daily., Disp: 90 tablet, Rfl: 1  Review of Systems  All other systems reviewed and are negative.   Social History   Tobacco Use  . Smoking status: Current Every Day Smoker    Packs/day: 0.75    Years: 43.00    Pack years: 32.25    Types: Cigarettes  . Smokeless tobacco: Never Used  Substance Use Topics  . Alcohol use: Yes    Alcohol/week: 0.0 standard drinks    Comment: occasional      Objective:   BP 121/85 (BP Location: Left Arm, Patient Position: Sitting)   Pulse 63   Temp 98.5 F (36.9 C) (Oral)   Resp 16   Wt 193 lb 6.4 oz (87.7 kg)   SpO2 97%   BMI 24.83 kg/m  Vitals:   07/18/18 0936  BP: 121/85  Pulse: 63  Resp: 16  Temp: 98.5 F (36.9 C)  TempSrc: Oral  SpO2: 97%  Weight: 193 lb 6.4 oz (87.7 kg)     Physical Exam Constitutional:      Appearance: Normal appearance.  Cardiovascular:     Rate and Rhythm: Normal rate and regular rhythm.     Heart sounds: Normal heart  sounds.  Pulmonary:     Effort: Pulmonary effort is normal.     Breath sounds: Normal breath sounds.  Skin:    General: Skin is warm and dry.  Neurological:     Mental Status: He is alert and oriented to person, place, and time. Mental status is at baseline.  Psychiatric:        Mood and Affect: Mood normal.        Behavior: Behavior normal.         Assessment & Plan    1. Hyperlipidemia, unspecified hyperlipidemia type  Check lipid panel today. Adjust as necessary. Come back later this year for CPE.   - rosuvastatin (CRESTOR) 5 MG tablet; Take 1 tablet (5 mg total) by mouth daily.  Dispense: 90 tablet; Refill: 1 - Lipid Profile  The entirety of the information documented in the History of Present Illness, Review of Systems and Physical Exam were personally obtained by me. Portions of this information were initially documented by Lexine Baton, LPN and reviewed by me for thoroughness and accuracy.  Trinna Post, PA-C  [facility]

## 2018-07-19 LAB — LIPID PANEL
Chol/HDL Ratio: 2.8 ratio (ref 0.0–5.0)
Cholesterol, Total: 164 mg/dL (ref 100–199)
HDL: 59 mg/dL (ref >39)
LDL Calculated: 89 mg/dL (ref 0–99)
Triglycerides: 81 mg/dL (ref 0–149)
VLDL Cholesterol Cal: 16 mg/dL (ref 5–40)

## 2018-08-01 ENCOUNTER — Telehealth: Payer: Self-pay

## 2018-08-01 DIAGNOSIS — E785 Hyperlipidemia, unspecified: Secondary | ICD-10-CM

## 2018-08-01 MED ORDER — ROSUVASTATIN CALCIUM 5 MG PO TABS: 5.0000 mg | ORAL_TABLET | Freq: Every day | ORAL | 1 refills | Status: DC

## 2018-08-01 NOTE — Telephone Encounter (Signed)
Patient calling that he is needing his cholesterol medicine to be send to [facility]. Reports that [facility] is not able to fill it for him? rosuvastatin (CRESTOR) 5 MG tablet

## 2018-08-01 NOTE — Telephone Encounter (Signed)
Patient was advised.  

## 2019-01-25 ENCOUNTER — Other Ambulatory Visit: Payer: Self-pay

## 2019-01-25 DIAGNOSIS — Z20822 Contact with and (suspected) exposure to covid-19: Secondary | ICD-10-CM

## 2019-01-27 LAB — NOVEL CORONAVIRUS, NAA: SARS-CoV-2, NAA: DETECTED — AB

## 2019-02-11 ENCOUNTER — Telehealth: Payer: Self-pay | Admitting: *Deleted

## 2019-02-11 NOTE — Telephone Encounter (Signed)
Contacted to schedule lung screening scan. Patient reports has had covid and requests a call back in 2 weeks to consider screening scan at that time.

## 2019-02-14 ENCOUNTER — Telehealth: Payer: Self-pay

## 2019-02-14 DIAGNOSIS — Z720 Tobacco use: Secondary | ICD-10-CM

## 2019-02-14 MED ORDER — NICOTINE 14 MG/24HR TD PT24: 14.0000 mg | MEDICATED_PATCH | Freq: Every day | TRANSDERMAL | 0 refills | Status: DC

## 2019-02-14 NOTE — Telephone Encounter (Signed)
Sent. One patch daily. He will need the next dose down in one month.

## 2019-02-14 NOTE — Telephone Encounter (Signed)
Patient would like a prescription for patches to help him stop smoking. He has been on Nicoderm CQ in the past. Please advise.   Pharmacy [facility] dr. Lady Gary

## 2019-02-14 NOTE — Telephone Encounter (Signed)
Patient advised.

## 2019-03-08 ENCOUNTER — Telehealth: Payer: Self-pay | Admitting: *Deleted

## 2019-03-08 NOTE — Telephone Encounter (Signed)
Patient has been notified that lung cancer screening CT scan is due currently or will be in near future. Confirmed that patient is within the appropriate age range, and asymptomatic, (no signs or symptoms of lung cancer). Patient denies illness that would prevent curative treatment for lung cancer if found. Verified smoking history. Patient mentioned that he has stopped smoking for about a month. He is a former smoker that would smoke a pack of cigarettes every 2 days for 44 years. Patient does not currently have health insurance and would like some financial assistance if possible or wait to schedule his CT scan in January of 2021.

## 2019-03-15 NOTE — Telephone Encounter (Signed)
Left voicemail in attempt to schedule and answer questions about financial assistance available for lung cancer screening.

## 2019-03-19 IMAGING — CT CT CHEST LUNG CANCER SCREENING LOW DOSE W/O CM
2 of 5 series · 15 of 40 positions shown, 18 images · non-contrast
Comparison: Chest radiograph 03/17/2017.  No prior CT.

CLINICAL DATA: [DATE] pack-year smoking history. Asymptomatic current
smoker.

EXAM:
CT CHEST WITHOUT CONTRAST LOW-DOSE FOR LUNG CANCER SCREENING
TECHNIQUE: Multidetector CT imaging of the chest was performed following the
standard protocol without IV contrast.

[Series 3: lung · axial · 0.70mm/px · z∈[-1262,-949]mm · 12 of 345 slices shown, 15 images (1 of 2)]
[im 16/345  mediastinal]
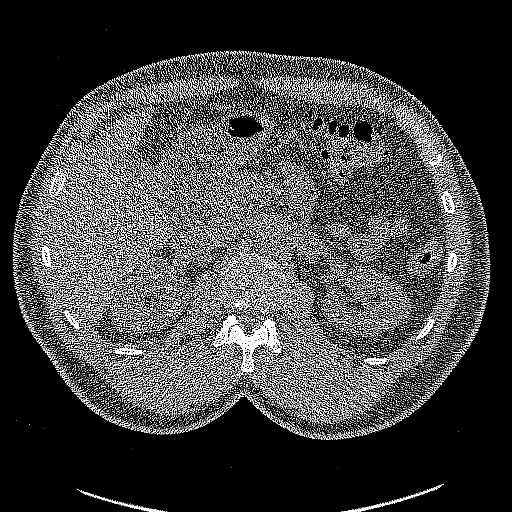
[im 16/345  lung]
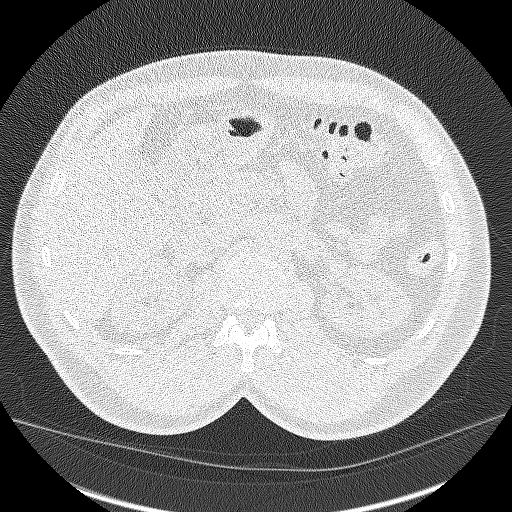
[im 47/345  lung]
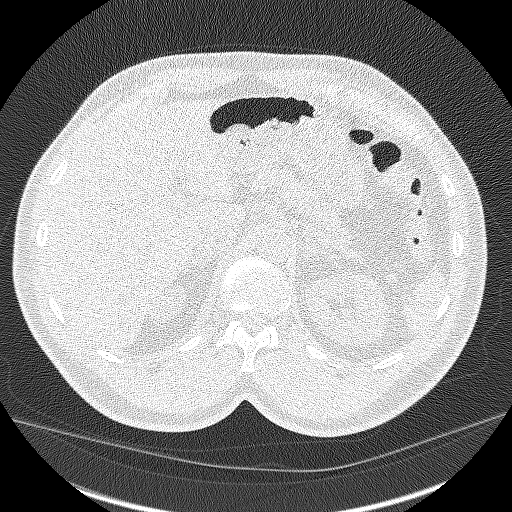
[im 79/345  lung]
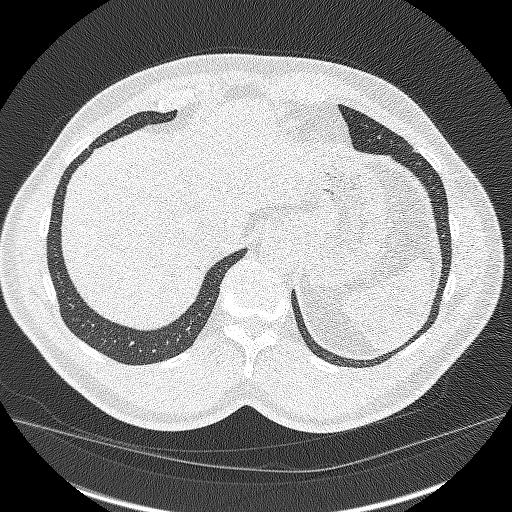
[im 110/345  lung]
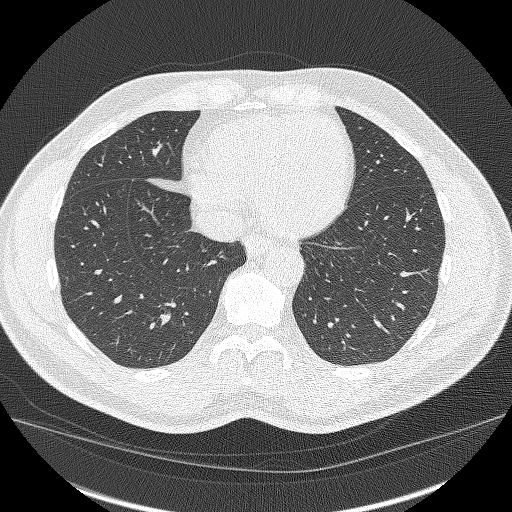
[im 126/345  mediastinal]
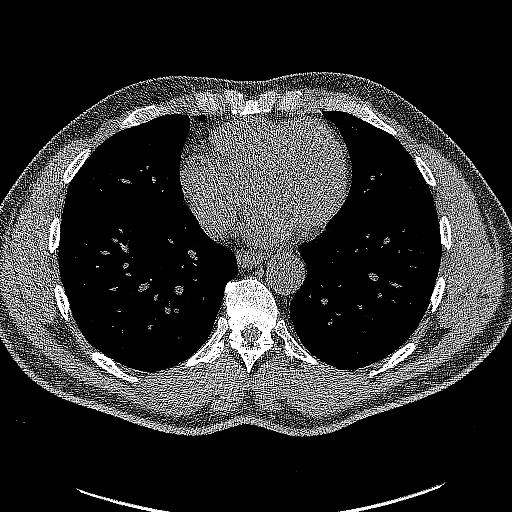
[im 126/345  lung]
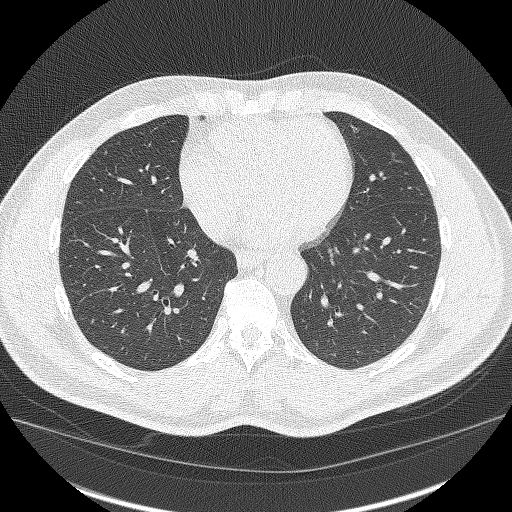
[im 157/345  lung]
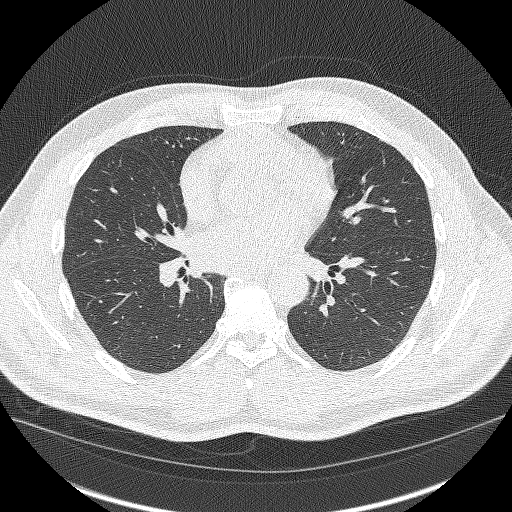
[im 188/345  lung]
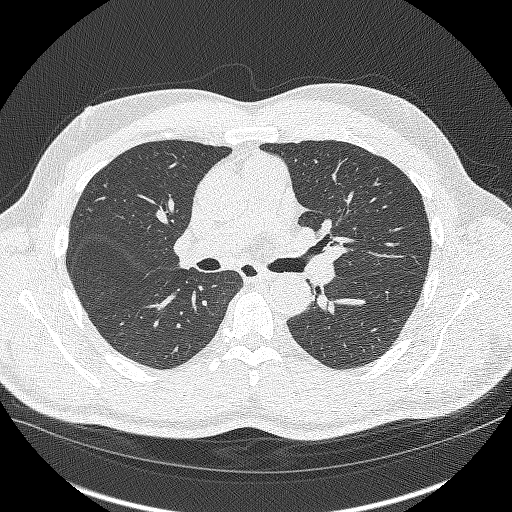
[im 219/345  lung]
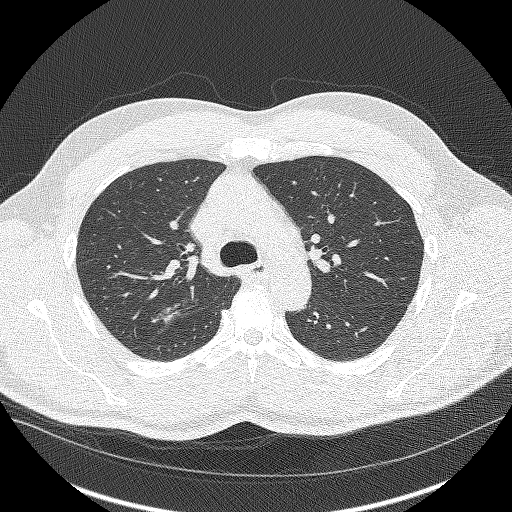
[im 235/345  mediastinal]
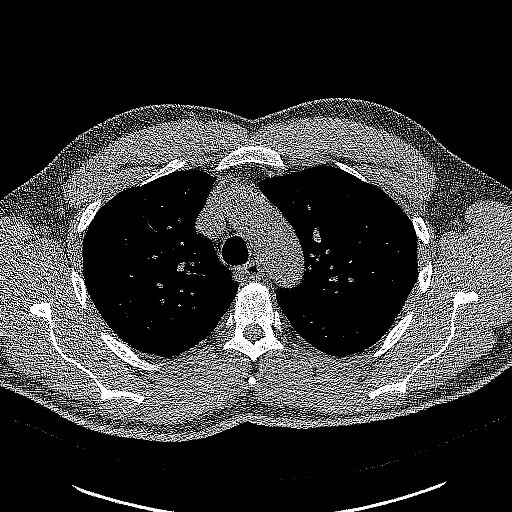
[im 235/345  lung]
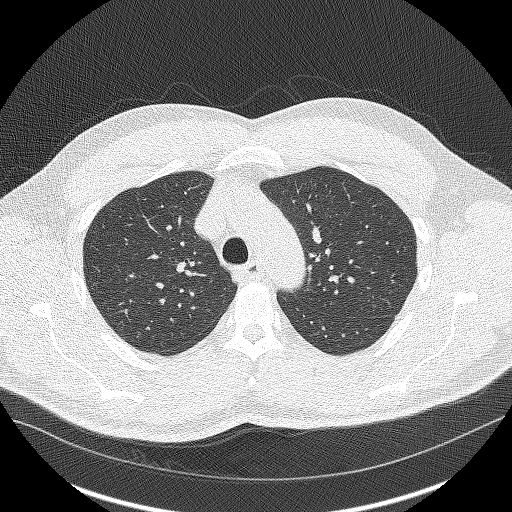
[im 266/345  lung]
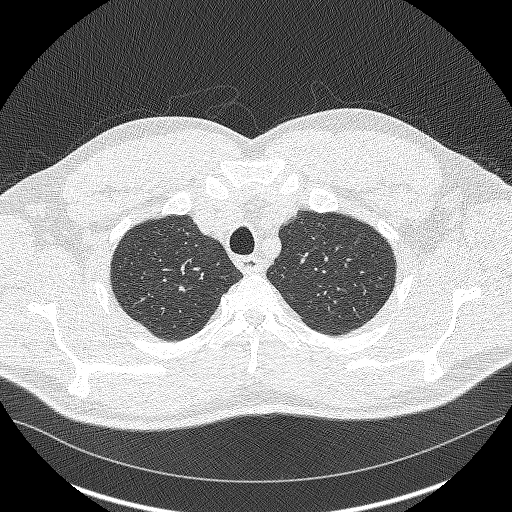
[im 298/345  lung]
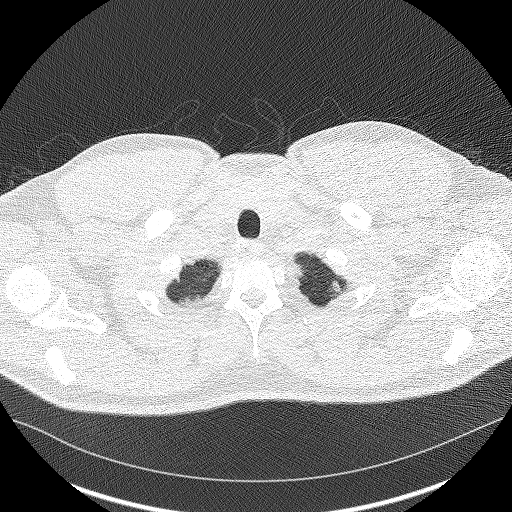
[im 329/345  lung]
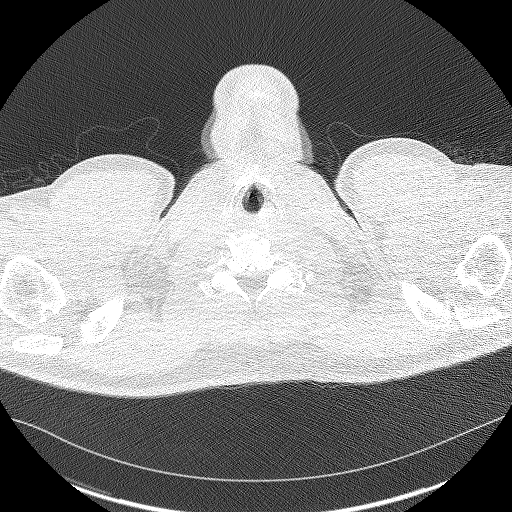

[Series 4: lung · coronal · 0.67mm/px · 3 of 358 slices shown (2 of 2)]
[im 72/358  lung]
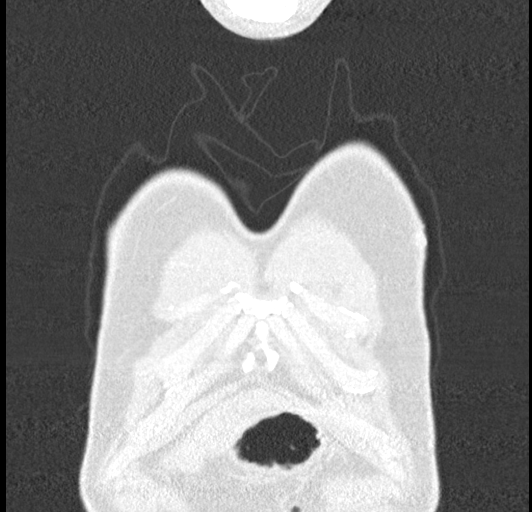
[im 143/358  lung]
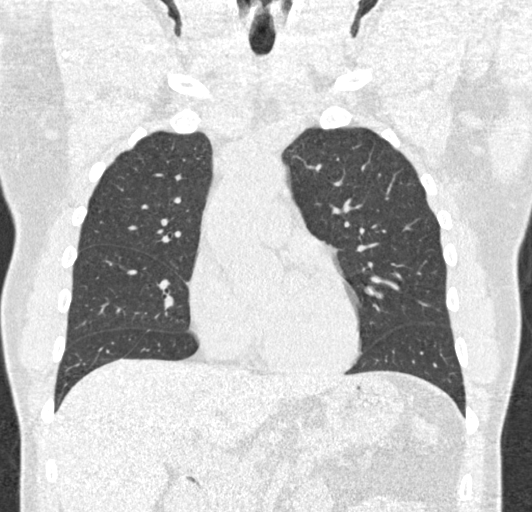
[im 215/358  lung]
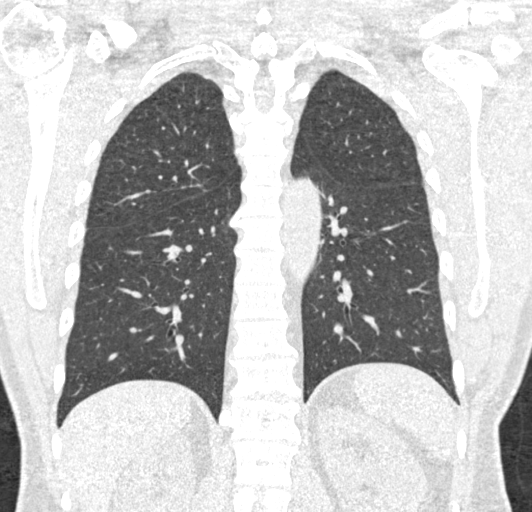

[15 of 40 positions shown; findings below may reference images not displayed]

FINDINGS: Cardiovascular: Normal aortic caliber. Normal heart size, without
pericardial effusion. Distal right coronary artery calcification on
image 50/2.

Mediastinum/Nodes: No mediastinal or definite hilar adenopathy,
given limitations of unenhanced CT.

Lungs/Pleura: No pleural fluid. Mild centrilobular emphysema.
Accessory fissure in the right lower lobe. Posterior right upper
lobe scarring. No suspicious pulmonary nodule or mass.

Upper Abdomen: Normal imaged portions of the liver, spleen, stomach,
pancreas, adrenal glands, kidneys.

Musculoskeletal: Lower cervical spondylosis.
IMPRESSION: 1. Lung-RADS 1, negative. Continue annual screening with low-dose
chest CT without contrast in 12 months.
2.  Emphysema ([id number]).
3. Distal right coronary artery atherosclerosis.

## 2019-05-07 ENCOUNTER — Encounter: Payer: 59 | Admitting: Physician Assistant

## 2019-05-08 ENCOUNTER — Other Ambulatory Visit: Payer: Self-pay

## 2019-05-08 ENCOUNTER — Encounter: Payer: Self-pay | Admitting: Physician Assistant

## 2019-05-08 ENCOUNTER — Ambulatory Visit (INDEPENDENT_AMBULATORY_CARE_PROVIDER_SITE_OTHER): Payer: BLUE CROSS/BLUE SHIELD | Admitting: Physician Assistant

## 2019-05-08 VITALS — BP 120/70 | Temp 97.1°F | Ht 74.0 in | Wt 197.0 lb

## 2019-05-08 DIAGNOSIS — Z0000 Encounter for general adult medical examination without abnormal findings: Secondary | ICD-10-CM

## 2019-05-08 DIAGNOSIS — Z23 Encounter for immunization: Secondary | ICD-10-CM

## 2019-05-08 DIAGNOSIS — E785 Hyperlipidemia, unspecified: Secondary | ICD-10-CM

## 2019-05-08 DIAGNOSIS — J439 Emphysema, unspecified: Secondary | ICD-10-CM

## 2019-05-08 MED ORDER — ROSUVASTATIN CALCIUM 5 MG PO TABS: 5.0000 mg | ORAL_TABLET | Freq: Every day | ORAL | 1 refills | Status: AC

## 2019-05-08 NOTE — Progress Notes (Signed)
Patient: Keith Mendez, Male    DOB: Aug 06, 1955, 64 y.o.   MRN: [id number] Visit Date: 05/08/2019  Today's Provider: Trinna Post, PA-C   Chief Complaint  Patient presents with  . Annual Exam   Subjective:  I, Porsha McClurkin,CMA am acting as a Education administrator for [employer].   Annual physical exam Keith Mendez is a 64 y.o. male who presents today for health maintenance and complete physical. He feels well. He reports exercising includes walking at work. He reports he is sleeping well.  Last colonoscopy:06/03/2013 with three hyperplastic polyps ranging in size from 1-3 mm  HLD: He hasn't been taking his crestor 5 mg QD for several months. The prescription ran out and he did not call for a refill.   Lipid Panel     Component Value Date/Time   CHOL 164 07/18/2018 1003   TRIG 81 07/18/2018 1003   HDL 59 07/18/2018 1003   CHOLHDL 2.8 07/18/2018 1003   CHOLHDL 3.2 10/04/2017 1131   VLDL 10 10/04/2017 1131   LDLCALC 89 07/18/2018 1003   LABVLDL 16 07/18/2018 1003    -----------------------------------------------------------------   Review of Systems  Constitutional: Negative.   HENT: Negative.   Eyes: Negative.   Respiratory: Negative.   Cardiovascular: Negative.   Gastrointestinal: Negative.   Endocrine: Negative.   Genitourinary: Negative.   Musculoskeletal: Negative.   Skin: Negative.   Allergic/Immunologic: Negative.   Neurological: Negative.   Hematological: Negative.   Psychiatric/Behavioral: Negative.     Social History He  reports that he has been smoking cigarettes. He has a 32.25 pack-year smoking history. He has never used smokeless tobacco. He reports current alcohol use. He reports current drug use. Drug: Other-see comments. Social History   Socioeconomic History  . Marital status: Married    Spouse name: Not on file  . Number of children: Not on file  . Years of education: Not on file  . Highest education level: Not on file    Occupational History  . Not on file  Tobacco Use  . Smoking status: Current Every Day Smoker    Packs/day: 0.75    Years: 43.00    Pack years: 32.25    Types: Cigarettes  . Smokeless tobacco: Never Used  Substance and Sexual Activity  . Alcohol use: Yes    Alcohol/week: 0.0 standard drinks    Comment: occasional  . Drug use: Yes    Types: Other-see comments    Comment: previously used steroids  . Sexual activity: Not on file  Other Topics Concern  . Not on file  Social History Narrative  . Not on file   Social Determinants of Health   Financial Resource Strain:   . Difficulty of Paying Living Expenses: Not on file  Food Insecurity:   . Worried About Charity fundraiser in the Last Year: Not on file  . Ran Out of Food in the Last Year: Not on file  Transportation Needs:   . Lack of Transportation (Medical): Not on file  . Lack of Transportation (Non-Medical): Not on file  Physical Activity:   . Days of Exercise per Week: Not on file  . Minutes of Exercise per Session: Not on file  Stress:   . Feeling of Stress : Not on file  Social Connections:   . Frequency of Communication with Friends and Family: Not on file  . Frequency of Social Gatherings with Friends and Family: Not on file  . Attends Religious  Services: Not on file  . Active Member of Clubs or Organizations: Not on file  . Attends Archivist Meetings: Not on file  . Marital Status: Not on file    Patient Active Problem List   Diagnosis Date Noted  . Pulmonary emphysema ([place]) 02/09/2018  . Herpes zoster without complication 16/01/9603  . LBP (low back pain) 08/13/2007  . Compulsive tobacco user syndrome 07/12/2007    Past Surgical History:  Procedure Laterality Date  . NO PAST SURGERIES      Family History  No family status information on file.   His He was adopted. Family history is unknown by patient.     No Known Allergies  Previous Medications   NICOTINE (NICODERM CQ) 14 MG/24HR  PATCH    Place 1 patch (14 mg total) onto the skin daily.   ROSUVASTATIN (CRESTOR) 5 MG TABLET    Take 1 tablet (5 mg total) by mouth daily.    Patient Care Team: Paulene Floor as PCP - General (Physician Assistant)      Objective:   Vitals: BP 120/70 (BP Location: Left Arm, Patient Position: Sitting, Cuff Size: Normal)   Temp (!) 97.1 F (36.2 C) (Temporal)   Ht '6\' 2"'  (1.88 m)   Wt 197 lb (89.4 kg)   BMI 25.29 kg/m    Physical Exam Constitutional:      Appearance: Normal appearance.  Cardiovascular:     Rate and Rhythm: Normal rate and regular rhythm.     Heart sounds: Normal heart sounds.  Pulmonary:     Effort: Pulmonary effort is normal.     Breath sounds: Normal breath sounds.  Abdominal:     General: Bowel sounds are normal.     Palpations: Abdomen is soft.  Skin:    General: Skin is warm and dry.  Neurological:     Mental Status: He is alert and oriented to person, place, and time. Mental status is at baseline.  Psychiatric:        Mood and Affect: Mood normal.        Behavior: Behavior normal.      Depression Screen PHQ 2/9 Scores 05/08/2019 10/04/2017  PHQ - 2 Score 0 0  PHQ- 9 Score 0 0      Assessment & Plan:     Routine Health Maintenance and Physical Exam  Exercise Activities and Dietary recommendations Goals   None     Immunization History  Administered Date(s) Administered  . Influenza,inj,Quad PF,6+ Mos 04/16/2018  . MMR 04/24/2003  . Pneumococcal Polysaccharide-23 04/16/2018  . Tdap 12/08/2014    Health Maintenance  Topic Date Due  . INFLUENZA VACCINE  11/10/2018  . COLONOSCOPY  06/04/2023  . TETANUS/TDAP  12/07/2024  . Hepatitis C Screening  Completed  . HIV Screening  Completed     Discussed health benefits of physical activity, and encouraged him to engage in regular exercise appropriate for his age and condition.    1. Annual physical exam  - CBC with Differential/Platelet - Comprehensive metabolic  panel - PSA - Hemoglobin A1c  2. Pulmonary emphysema, unspecified emphysema type ([place])  Due for yearly Lung CT, patient needs to schedule.   3. Hyperlipidemia, unspecified hyperlipidemia type  - Lipid panel - rosuvastatin (CRESTOR) 5 MG tablet; Take 1 tablet (5 mg total) by mouth daily.  Dispense: 90 tablet; Refill: 1  4. Need for influenza vaccination  - Flu Vaccine QUAD 36+ mos IM  The entirety of the information documented in  the History of Present Illness, Review of Systems and Physical Exam were personally obtained by me. Portions of this information were initially documented by [facility], CMA and reviewed by me for thoroughness and accuracy.      --------------------------------------------------------------------

## 2019-05-09 ENCOUNTER — Telehealth: Payer: Self-pay

## 2019-05-09 LAB — CBC WITH DIFFERENTIAL/PLATELET
Basophils Absolute: 0.1 10*3/uL (ref 0.0–0.2)
Basos: 1 %
EOS (ABSOLUTE): 0.2 10*3/uL (ref 0.0–0.4)
Eos: 3 %
Hematocrit: 43.3 % (ref 37.5–51.0)
Hemoglobin: 14.2 g/dL (ref 13.0–17.7)
Immature Grans (Abs): 0 10*3/uL (ref 0.0–0.1)
Immature Granulocytes: 1 %
Lymphocytes Absolute: 1.7 10*3/uL (ref 0.7–3.1)
Lymphs: 30 %
MCH: 29.5 pg (ref 26.6–33.0)
MCHC: 32.8 g/dL (ref 31.5–35.7)
MCV: 90 fL (ref 79–97)
Monocytes Absolute: 0.5 10*3/uL (ref 0.1–0.9)
Monocytes: 8 %
Neutrophils Absolute: 3.3 10*3/uL (ref 1.4–7.0)
Neutrophils: 57 %
Platelets: 200 10*3/uL (ref 150–450)
RBC: 4.82 x10E6/uL (ref 4.14–5.80)
RDW: 13.7 % (ref 11.6–15.4)
WBC: 5.8 10*3/uL (ref 3.4–10.8)

## 2019-05-09 LAB — LIPID PANEL
Chol/HDL Ratio: 3.3 ratio (ref 0.0–5.0)
Cholesterol, Total: 193 mg/dL (ref 100–199)
HDL: 59 mg/dL (ref >39)
LDL Chol Calc (NIH): 114 mg/dL — ABNORMAL HIGH (ref 0–99)
Triglycerides: 112 mg/dL (ref 0–149)
VLDL Cholesterol Cal: 20 mg/dL (ref 5–40)

## 2019-05-09 LAB — PSA: Prostate Specific Ag, Serum: 1.2 ng/mL (ref 0.0–4.0)

## 2019-05-09 LAB — COMPREHENSIVE METABOLIC PANEL
ALT: 19 IU/L (ref 0–44)
AST: 21 IU/L (ref 0–40)
Albumin/Globulin Ratio: 1.9 (ref 1.2–2.2)
Albumin: 4.3 g/dL (ref 3.8–4.8)
Alkaline Phosphatase: 77 IU/L (ref 39–117)
BUN/Creatinine Ratio: 16 (ref 10–24)
BUN: 21 mg/dL (ref 8–27)
Bilirubin Total: 0.3 mg/dL (ref 0.0–1.2)
CO2: 20 mmol/L (ref 20–29)
Calcium: 9.1 mg/dL (ref 8.6–10.2)
Chloride: 107 mmol/L — ABNORMAL HIGH (ref 96–106)
Creatinine, Ser: 1.3 mg/dL — ABNORMAL HIGH (ref 0.76–1.27)
GFR calc Af Amer: 68 mL/min/{1.73_m2} (ref >59)
GFR calc non Af Amer: 58 mL/min/{1.73_m2} — ABNORMAL LOW (ref >59)
Globulin, Total: 2.3 g/dL (ref 1.5–4.5)
Glucose: 89 mg/dL (ref 65–99)
Potassium: 4.3 mmol/L (ref 3.5–5.2)
Sodium: 142 mmol/L (ref 134–144)
Total Protein: 6.6 g/dL (ref 6.0–8.5)

## 2019-05-09 LAB — HEMOGLOBIN A1C
Est. average glucose Bld gHb Est-mCnc: 117 mg/dL
Hgb A1c MFr Bld: 5.7 % — ABNORMAL HIGH (ref 4.8–5.6)

## 2019-05-09 NOTE — Patient Instructions (Signed)

## 2019-05-09 NOTE — Telephone Encounter (Signed)
-----   Message from Trey Sailors, [place] sent at 05/09/2019  1:04 PM EST ----- Cholesterol is high but he hasn't been taking his medication consistently so he should do that. His A1c showed his sugars were prediabetic or borderline diabetes. We should keep an eye on this every 6 months to 1 year. The rest of his labwork was normal.

## 2019-05-09 NOTE — Telephone Encounter (Signed)
Patient was advised and states that he was going to work on getting his sugar levels.

## 2019-07-02 ENCOUNTER — Telehealth: Payer: Self-pay

## 2019-07-02 NOTE — Telephone Encounter (Signed)
Message left notifying patient that it is time to schedule the low dose lung cancer screening CT scan.  Instructed patient to return call to Shawn Perkins at [phone] to verify information prior to CT scan being scheduled.    

## 2019-07-18 ENCOUNTER — Encounter: Payer: Self-pay | Admitting: *Deleted

## 2019-07-23 ENCOUNTER — Other Ambulatory Visit: Payer: Self-pay

## 2019-07-23 ENCOUNTER — Encounter: Payer: Self-pay | Admitting: Physician Assistant

## 2019-07-23 ENCOUNTER — Ambulatory Visit (INDEPENDENT_AMBULATORY_CARE_PROVIDER_SITE_OTHER): Payer: BLUE CROSS/BLUE SHIELD | Admitting: Physician Assistant

## 2019-07-23 VITALS — BP 132/86 | HR 87 | Temp 97.5°F | Resp 16 | Ht 74.0 in | Wt 194.0 lb

## 2019-07-23 DIAGNOSIS — F17200 Nicotine dependence, unspecified, uncomplicated: Secondary | ICD-10-CM

## 2019-07-23 DIAGNOSIS — Z7185 Encounter for immunization safety counseling: Secondary | ICD-10-CM

## 2019-07-23 DIAGNOSIS — L299 Pruritus, unspecified: Secondary | ICD-10-CM | POA: Diagnosis not present

## 2019-07-23 DIAGNOSIS — Z720 Tobacco use: Secondary | ICD-10-CM

## 2019-07-23 DIAGNOSIS — Z7189 Other specified counseling: Secondary | ICD-10-CM | POA: Diagnosis not present

## 2019-07-23 MED ORDER — HYDROXYZINE HCL 10 MG PO TABS: 10.0000 mg | ORAL_TABLET | Freq: Three times a day (TID) | ORAL | 0 refills | Status: AC | PRN

## 2019-07-23 MED ORDER — BUPROPION HCL ER (SR) 150 MG PO TB12: ORAL_TABLET | ORAL | 1 refills | Status: AC

## 2019-07-23 NOTE — Progress Notes (Signed)
    Established patient visit     I,Bretlyn Ward,acting as a scribe for Trey Sailors, PA-C.,have documented all relevant documentation on the behalf of Trey Sailors, PA-C,as directed by  Trey Sailors, PA-C while in the presence of Trey Sailors, PA-C.    Patient: Keith Mendez   DOB: October 24, 1955   64 y.o. Male  MRN: [id number] Visit Date: 07/23/2019  Today's healthcare provider: Trey Sailors, PA-C  Subjective:    Chief Complaint  Patient presents with  . Rash   Rash This is a new problem. The current episode started in the past 7 days. The affected locations include the right lower leg and left lower leg. The rash is characterized by pain, burning and itchiness. He was exposed to plant contact. Associated symptoms include fatigue. Pertinent negatives include no fever or shortness of breath. Past treatments include analgesics and anti-itch cream. The treatment provided no relief.  Nicotine Dependence Presents for follow-up visit. Symptoms include fatigue. His urge triggers include company of smokers. The symptoms have been stable. He smokes < 1/2 a pack of cigarettes per day. Compliance with prior treatments has been variable.        Medications: Outpatient Medications Prior to Visit  Medication Sig  . rosuvastatin (CRESTOR) 5 MG tablet Take 1 tablet (5 mg total) by mouth daily.   No facility-administered medications prior to visit.    Review of Systems  Constitutional: Positive for fatigue. Negative for fever.  HENT: Negative.   Eyes: Negative.   Respiratory: Negative.  Negative for shortness of breath.   Cardiovascular: Negative.   Gastrointestinal: Negative.   Endocrine: Negative.   Genitourinary: Negative.   Musculoskeletal: Negative.   Skin: Positive for rash.  Allergic/Immunologic: Negative.   Neurological: Negative.   Hematological: Negative.   Psychiatric/Behavioral: Negative.         Objective:    BP 132/86   Pulse 87   Temp (!)  97.5 F (36.4 C) (Temporal)   Resp 16   Ht 6\' 2"  (1.88 m)   Wt 194 lb (88 kg)   BMI 24.91 kg/m    Physical Exam Cardiovascular:     Rate and Rhythm: Normal rate.  Pulmonary:     Effort: Pulmonary effort is normal.  Skin:    Findings: No rash.       Neurological:     Mental Status: He is alert and oriented to person, place, and time. Mental status is at baseline.  Psychiatric:        Mood and Affect: Mood normal.        Behavior: Behavior normal.       No results found for any visits on 07/23/19.    Assessment & Plan:    1. Itching No rash appreciated. uncertain etiology. Trial of Hydroxyzine 10 mg 3 x daily as needed. Follow up if worsening and will obtain labs.   2. Compulsive tobacco user syndrome  - buPROPion (WELLBUTRIN SR) 150 MG 12 hr tablet; Start at one pill daily for at least a week.  Dispense: 180 tablet; Refill: 1  3. Tobacco abuse   4. Vaccine counseling Discussed that he will need Pneumo vaccine at age 60.    Follow up scheduled for Physical in January 2022.      February 2022  [facility] [phone] (phone) [phone] (fax)  [facility]

## 2020-05-06 ENCOUNTER — Ambulatory Visit: Payer: Self-pay | Admitting: Physician Assistant

## 2020-05-15 ENCOUNTER — Encounter: Payer: BLUE CROSS/BLUE SHIELD | Admitting: Physician Assistant

## 2020-05-15 ENCOUNTER — Other Ambulatory Visit: Payer: Self-pay

## 2020-05-15 NOTE — Progress Notes (Deleted)
{This patient's chart is due for periodic physician review. Please check 'Cosign Required' and forward to your supervising physician.:1}  Complete physical exam   Patient: Keith Mendez   DOB: 1955/10/25   65 y.o. Male  MRN: [id number] Visit Date: 05/15/2020  Today's healthcare provider: Trinna Post, PA-C   No chief complaint on file.  Subjective    Keith Mendez is a 65 y.o. male who presents today for a complete physical exam.  He reports consuming a {diet types:17450} diet. {Exercise:19826} He generally feels {well/fairly well/poorly:18703}. He reports sleeping {well/fairly well/poorly:18703}. He {does/does not:200015} have additional problems to discuss today.  HPI    Past Medical History:  Diagnosis Date  . Cervicalgia   . Chronic kidney disease 09/04/2007  . Herpes zoster   . Lumbago    Past Surgical History:  Procedure Laterality Date  . NO PAST SURGERIES     Social History   Socioeconomic History  . Marital status: Married    Spouse name: Not on file  . Number of children: Not on file  . Years of education: Not on file  . Highest education level: Not on file  Occupational History  . Not on file  Tobacco Use  . Smoking status: Current Every Day Smoker    Packs/day: 0.75    Years: 43.00    Pack years: 32.25    Types: Cigarettes  . Smokeless tobacco: Never Used  Substance and Sexual Activity  . Alcohol use: Yes    Alcohol/week: 0.0 standard drinks    Comment: occasional  . Drug use: Yes    Types: Other-see comments    Comment: previously used steroids  . Sexual activity: Not on file  Other Topics Concern  . Not on file  Social History Narrative  . Not on file   Social Determinants of Health   Financial Resource Strain: Not on file  Food Insecurity: Not on file  Transportation Needs: Not on file  Physical Activity: Not on file  Stress: Not on file  Social Connections: Not on file  Intimate Partner Violence: Not on file   No family  status information on file.   Family History  Adopted: Yes  Family history unknown: Yes   No Known Allergies  Patient Care Team: Paulene Floor as PCP - General (Physician Assistant)   Medications: Outpatient Medications Prior to Visit  Medication Sig  . buPROPion (WELLBUTRIN SR) 150 MG 12 hr tablet Start at one pill daily for at least a week.  . hydrOXYzine (ATARAX/VISTARIL) 10 MG tablet Take 1 tablet (10 mg total) by mouth 3 (three) times daily as needed.  . rosuvastatin (CRESTOR) 5 MG tablet Take 1 tablet (5 mg total) by mouth daily.   No facility-administered medications prior to visit.    Review of Systems  {Labs  Heme  Chem  Endocrine  Serology  Results Review (optional):23779::" "}  Objective    There were no vitals taken for this visit. {Show previous vital signs (optional):23777::" "}  Physical Exam  ***  Last depression screening scores PHQ 2/9 Scores 05/08/2019 10/04/2017  PHQ - 2 Score 0 0  PHQ- 9 Score 0 0   Last fall risk screening Fall Risk  05/08/2019  Falls in the past year? 0  Number falls in past yr: 0  Injury with Fall? 0   Last Audit-C alcohol use screening Alcohol Use Disorder Test (AUDIT) 05/08/2019  1. How often do you have a drink containing alcohol? 0  2. How many drinks containing alcohol do you have on a typical day when you are drinking? 0  3. How often do you have six or more drinks on one occasion? 0  AUDIT-C Score 0   A score of 3 or more in women, and 4 or more in men indicates increased risk for alcohol abuse, EXCEPT if all of the points are from question 1   No results found for any visits on 05/15/20.  Assessment & Plan    Routine Health Maintenance and Physical Exam  Exercise Activities and Dietary recommendations Goals   None     Immunization History  Administered Date(s) Administered  . Influenza,inj,Quad PF,6+ Mos 04/16/2018, 05/08/2019  . MMR 04/24/2003  . Pneumococcal Polysaccharide-23 04/16/2018  .  Tdap 12/08/2014    Health Maintenance  Topic Date Due  . COVID-19 Vaccine (1) Never done  . INFLUENZA VACCINE  11/10/2019  . COLONOSCOPY (Pts 45-91yr Insurance coverage will need to be confirmed)  06/04/2023  . TETANUS/TDAP  12/07/2024  . Hepatitis C Screening  Completed  . HIV Screening  Completed    Discussed health benefits of physical activity, and encouraged him to engage in regular exercise appropriate for his age and condition.  ***  No follow-ups on file.     {provider attestation***:1}   [phone](phone) [phone](fax)  [place]

## 2021-04-15 ENCOUNTER — Telehealth: Payer: Self-pay

## 2021-04-15 NOTE — Telephone Encounter (Signed)
I have not seen it.

## 2021-04-15 NOTE — Telephone Encounter (Signed)
Copied from [place] [phone]. Topic: General - Deceased Patient >> May 04, 2021 12:52 PM Yvette Rack wrote: Reason for CRM: Pt spouse Lorren Commander asked if a provider could sign the pt death certificate. Cb# [phone]  Route to department's PEC Pool.

## 2021-04-15 NOTE — Telephone Encounter (Signed)
Have you seen a death certificate on this patient?

## 2021-04-19 ENCOUNTER — Telehealth: Payer: Self-pay | Admitting: Physician Assistant

## 2021-04-19 NOTE — Telephone Encounter (Signed)
Funeral ome came back and said Dr. B was going to sign his death cert.

## 2021-04-19 NOTE — Telephone Encounter (Signed)
Jola Babinski with [facility] called asking if some can sigh this patients death Cert.

## 2021-04-19 NOTE — Telephone Encounter (Signed)
It probably went to that website, can't remember the name or they probably send it under [facility]?

## 2021-04-19 NOTE — Telephone Encounter (Signed)
Spouse called back to see what the status of physician signing patient death certificate. [place] [phone] informed family they have reached out several times to doctor office with no response. Caller stated patient will be buried this Saturday 04/24/2021 and this needs to be done as soon as possible. Caller would like a follow up call.

## 2021-04-20 NOTE — Telephone Encounter (Signed)
[facility] and family advised.

## 2021-04-20 NOTE — Telephone Encounter (Signed)
Just got it sent from the funeral home to my email yesterday. I am back in the office and will complete it today. Please let wife know and pass along condolences. Thanks!

## 2021-05-12 DEATH — deceased
